# Patient Record
Sex: Male | Born: 1980
Health system: Southern US, Community
[De-identification: ages and names within clinical notes are randomized; demographics above are authoritative.]

## PROBLEM LIST (undated history)

## (undated) HISTORY — PX: NO PAST SURGERIES: SHX2092

---

## 2012-09-07 ENCOUNTER — Other Ambulatory Visit: Payer: Self-pay | Admitting: Family Medicine

## 2012-09-07 DIAGNOSIS — R1013 Epigastric pain: Secondary | ICD-10-CM

## 2012-09-09 ENCOUNTER — Ambulatory Visit
Admission: RE | Admit: 2012-09-09 | Discharge: 2012-09-09 | Disposition: A | Discharge status: Home / Self Care | Payer: BC Managed Care – PPO | Source: Ambulatory Visit | Attending: Family Medicine | Admitting: Family Medicine

## 2012-09-09 DIAGNOSIS — R1013 Epigastric pain: Secondary | ICD-10-CM

## 2012-11-30 ENCOUNTER — Other Ambulatory Visit: Payer: Self-pay | Admitting: Family Medicine

## 2012-11-30 DIAGNOSIS — R161 Splenomegaly, not elsewhere classified: Secondary | ICD-10-CM

## 2012-12-03 ENCOUNTER — Other Ambulatory Visit: Payer: BC Managed Care – PPO

## 2012-12-10 ENCOUNTER — Ambulatory Visit
Admission: RE | Admit: 2012-12-10 | Discharge: 2012-12-10 | Disposition: A | Discharge status: Home / Self Care | Payer: BC Managed Care – PPO | Source: Ambulatory Visit | Attending: Family Medicine | Admitting: Family Medicine

## 2012-12-10 DIAGNOSIS — R161 Splenomegaly, not elsewhere classified: Secondary | ICD-10-CM

## 2012-12-17 ENCOUNTER — Telehealth: Payer: Self-pay | Admitting: Hematology & Oncology

## 2012-12-17 NOTE — Telephone Encounter (Signed)
Left pt message to call and schedule appointment °

## 2012-12-18 ENCOUNTER — Telehealth: Payer: Self-pay | Admitting: Hematology & Oncology

## 2012-12-18 NOTE — Telephone Encounter (Signed)
Left pt message to call and schedule appointment °

## 2012-12-21 ENCOUNTER — Telehealth: Payer: Self-pay | Admitting: Hematology & Oncology

## 2012-12-21 NOTE — Telephone Encounter (Signed)
Pt made 5-30 appointment

## 2012-12-21 NOTE — Telephone Encounter (Signed)
Left message for pt to call and make appointment

## 2013-01-29 ENCOUNTER — Other Ambulatory Visit (HOSPITAL_BASED_OUTPATIENT_CLINIC_OR_DEPARTMENT_OTHER): Payer: BC Managed Care – PPO | Admitting: Lab

## 2013-01-29 ENCOUNTER — Ambulatory Visit (HOSPITAL_BASED_OUTPATIENT_CLINIC_OR_DEPARTMENT_OTHER): Payer: BC Managed Care – PPO | Admitting: Hematology & Oncology

## 2013-01-29 ENCOUNTER — Ambulatory Visit: Payer: BC Managed Care – PPO

## 2013-01-29 VITALS — BP 121/69 | HR 70 | Temp 98.2°F | Resp 18 | Ht 73.0 in | Wt 174.0 lb

## 2013-01-29 DIAGNOSIS — R161 Splenomegaly, not elsewhere classified: Secondary | ICD-10-CM

## 2013-01-29 LAB — CBC WITH DIFFERENTIAL (CANCER CENTER ONLY)
BASO%: 0.3 % (ref 0.0–2.0)
Eosinophils Absolute: 0.2 10*3/uL (ref 0.0–0.5)
LYMPH#: 1.4 10*3/uL (ref 0.9–3.3)
LYMPH%: 21 % (ref 14.0–48.0)
MONO#: 0.6 10*3/uL (ref 0.1–0.9)
NEUT#: 4.4 10*3/uL (ref 1.5–6.5)
Platelets: 186 10*3/uL (ref 145–400)
RBC: 4.93 10*6/uL (ref 4.20–5.70)
RDW: 12.5 % (ref 11.1–15.7)
WBC: 6.7 10*3/uL (ref 4.0–10.0)

## 2013-01-29 LAB — CHCC SATELLITE - SMEAR

## 2013-01-29 NOTE — Progress Notes (Signed)
This office note has been dictated.

## 2013-02-01 NOTE — Progress Notes (Signed)
CC:   Adaku Nnodi, MD  DIAGNOSIS:  Splenomegaly.  HISTORY OF PRESENT ILLNESS:  Mr. William Woodard is a really nice 32 year old white gentleman.  He has really had no medical issues.  He has had some recent abdominal discomfort.  This is mostly in the __________ upper quadrant.  He saw Dr. Ihor Dow at [facility].  On exam, it appeared that there was enlarged spleen.  Back in January, an ultrasound was done.  This showed some splenomegaly. Spleen measured 13.7 x 15.5 x 5.7 cm.  Everything else looked pretty much unremarkable.  A repeat ultrasound was done in April.  This basically showed stable splenomegaly.  Everything else looked fine within the abdomen and pelvis.  Liver looked okay.  There was no lymphadenopathy.  She then referred Mr. Ceci over to the [facility] for an evaluation.  He has had no fevers, sweats, or chills.  He does have occasional left upper quadrant abdominal discomfort.  He has had no change in bowel or bladder habits.  He has had no weight loss or weight gain.  There have been no rashes.  He has had no pruritus.  He does state, however, that when he drinks alcohol, there is some pain in the left upper quadrant.  He has had no dysphagia or odynophagia.  He has had no nausea or vomiting.  Overall, his performance status is ECOG 0.  PAST MEDIAL HISTORY:  Pretty much unremarkable.  ALLERGIES:  None.  MEDICATIONS:  Multivitamin daily.  SOCIAL HISTORY:  Negative for tobacco use.  There is rare alcohol use. He has no occupational exposures.  There is no obvious risk factor for hepatitis or HIV.  FAMILY HISTORY:  Noncontributory.  REVIEW OF SYSTEMS:  As stated in the History of Present Illness.  Again, there may be some occasional left upper quadrant abdominal discomfort.  PHYSICAL EXAMINATION:  General:  This is a well-developed, well- nourished white gentleman in no obvious distress.  Vital  signs: Temperature of 98.2, pulse 70, respiratory rate 18, blood pressure 121/69.  Weight is 174.  Head and neck:  Normocephalic, atraumatic skull.  There are no ocular or oral lesions.  There are no palpable cervical or supraclavicular lymph nodes.  Lungs:  Clear bilaterally. There are no rales, wheezes, or rhonchi.  Cardiac:  Regular rate and rhythm with a normal S1 and S2.  There are no murmurs, rubs, or bruits. Abdomen:  Soft, with good bowel sounds.  There is no palpable abdominal mass.  There is no fluid wave.  His spleen tip is about 3 or 4 cm below the left costal margin.  There is no palpable hepatomegaly.  Axillary: No bilateral axillary adenopathy.  Back:  No tenderness over the spine, ribs, or hips.  Extremities:  No clubbing, cyanosis, or edema. Neurological:  No focal neurological deficits.  Skin:  No rashes, ecchymoses, or petechiae.  LABORATORY STUDIES:  White cell count 6.7, hemoglobin 15.1, hematocrit 41.6, platelet count 186.  Peripheral smear shows a normochromic normocytic population of red blood cells.  There are no nucleated red blood cells.  There are no teardrop cells.  I see no rouleaux formations.  There are no schistocytes.  White cells appear normal in morphology and maturation.  There are no immature myeloid or lymphoid forms.  I see no blasts.  There are no hypersegmented polys.  Platelets are adequate in number and size.  IMPRESSION:  Mr. Arcidiacono is a 32 year old gentleman with splenomegaly.  By my  exam, it is as if I can feel his spleen.  He does have some pretty good abdominal muscles.  It is certainly possible I could be feeling the musculature and maybe not his spleen.  However, when I put him onto his right side, I can feel the spleen move with inspiration.  I think that a CT scan would be helpful.  I think this would give [place] a better look at the spleen.  It will give me an idea as to whether or not there is lymphadenopathy.  I am also sending off  a JAK2 assay on him.  I do not see anything that would suggest a collagen vascular disease. As such,  Felty syndrome would be unlikely.  I do not think that a bone marrow biopsy is going to be indicated right now.  However, if his JAK2 assay is positive, then I think a bone marrow biopsy would be reasonable.  I spent a good hour or so with Mr. Coolman.  Hopefully, we can try to take care of a lot of this over the phone.  I think that a splenectomy would be the last thing to perform in order to diagnosis this.  It is possible that he just may have a benign idiopathic splenomegaly.  We will plan to get Mr. Fedewa back in the future depending on what our CT scan shows and what our labs show.    ______________________________ Josph Macho, M.D. PRE/MEDQ  D:  01/29/2013  T:  01/30/2013  Job:  [id number]

## 2013-02-02 ENCOUNTER — Ambulatory Visit (HOSPITAL_BASED_OUTPATIENT_CLINIC_OR_DEPARTMENT_OTHER)
Admission: RE | Admit: 2013-02-02 | Discharge: 2013-02-02 | Disposition: A | Discharge status: Home / Self Care | Payer: BC Managed Care – PPO | Source: Ambulatory Visit | Attending: Hematology & Oncology | Admitting: Hematology & Oncology

## 2013-02-02 DIAGNOSIS — R161 Splenomegaly, not elsewhere classified: Secondary | ICD-10-CM

## 2013-02-02 LAB — HEMOGLOBINOPATHY EVALUATION
Hgb A2 Quant: 2.6 % (ref 2.2–3.2)
Hgb F Quant: 0 % (ref 0.0–2.0)

## 2013-02-02 LAB — LACTATE DEHYDROGENASE: LDH: 176 U/L (ref 94–250)

## 2013-02-02 LAB — RHEUMATOID FACTOR: Rhuematoid fact SerPl-aCnc: <10 IU/mL (ref <=14)

## 2013-02-02 MED ORDER — IOHEXOL 300 MG/ML  SOLN
100.0000 mL | Freq: Once | INTRAMUSCULAR | Status: AC | PRN
  Administered 2013-02-02: 100 mL via INTRAVENOUS

## 2013-02-05 ENCOUNTER — Other Ambulatory Visit (HOSPITAL_BASED_OUTPATIENT_CLINIC_OR_DEPARTMENT_OTHER): Payer: BC Managed Care – PPO

## 2013-11-09 IMAGING — CT CT ABD-PELV W/ CM
2 of 3 series · 14 of 46 positions shown, 16 images · IV contrast (omnipaque)
Comparison: Ultrasound abdomen 12/10/2012

CT CHEST

CLINICAL DATA: Splenomegaly.  Questionable non Hodgkin's lymphoma

CT CHEST, ABDOMEN AND PELVIS WITH CONTRAST
TECHNIQUE: Multidetector CT imaging of the chest, abdomen and
pelvis was performed following the standard protocol during bolus
administration of intravenous contrast.
Contrast: 100mL OMNIPAQUE IOHEXOL 300 MG/ML  SOLN,

[Series 2: chest/abd/pel 5.0 b31f · axial · 0.65mm/px · z∈[+882,+1488]mm · 11 of 139 slices shown, 13 images]
[im 9/139  soft-tissue]
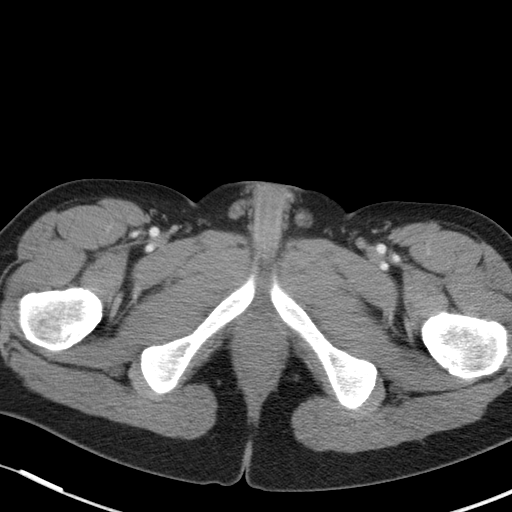
[im 9/139  bone]
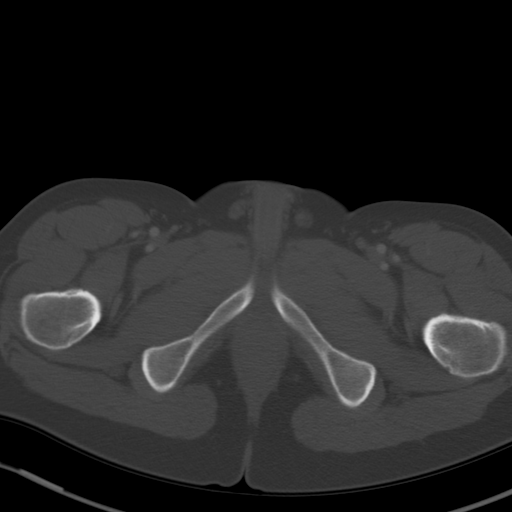
[im 23/139  soft-tissue]
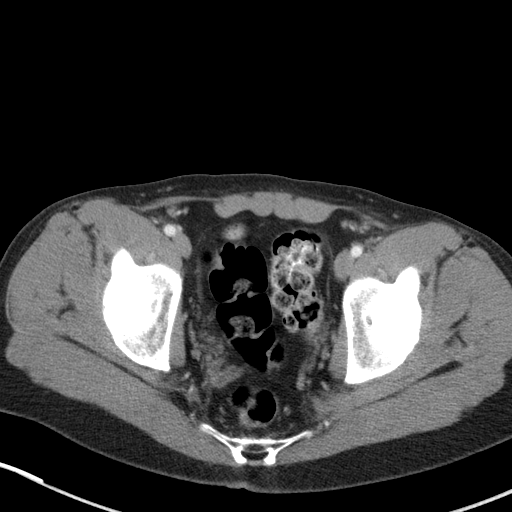
[im 32/139  soft-tissue]
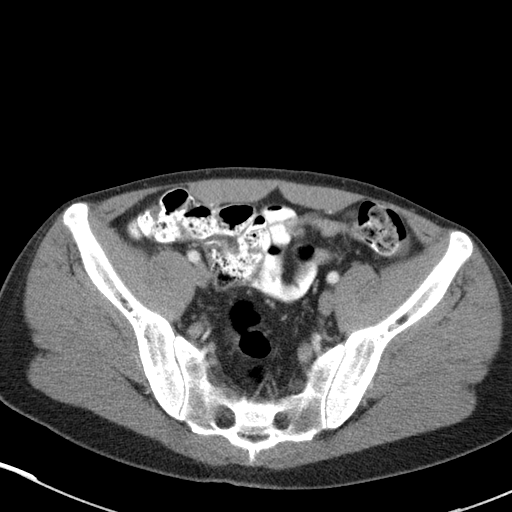
[im 45/139  soft-tissue]
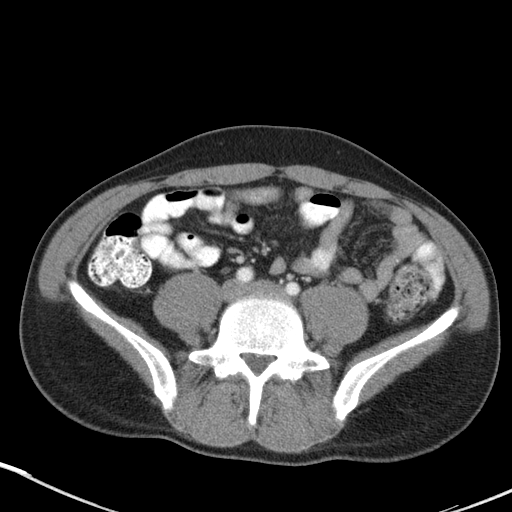
[im 58/139  soft-tissue]
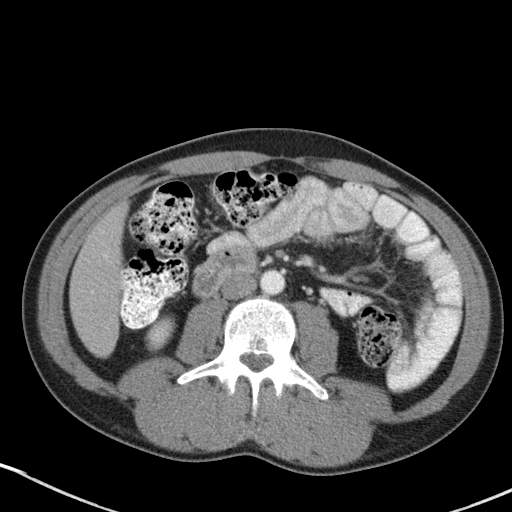
[im 72/139  soft-tissue]
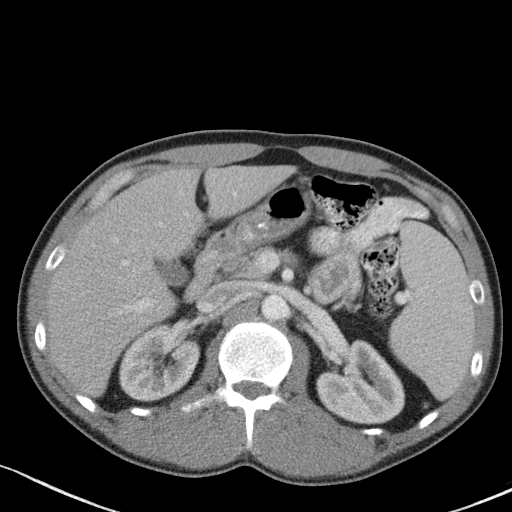
[im 81/139  soft-tissue]
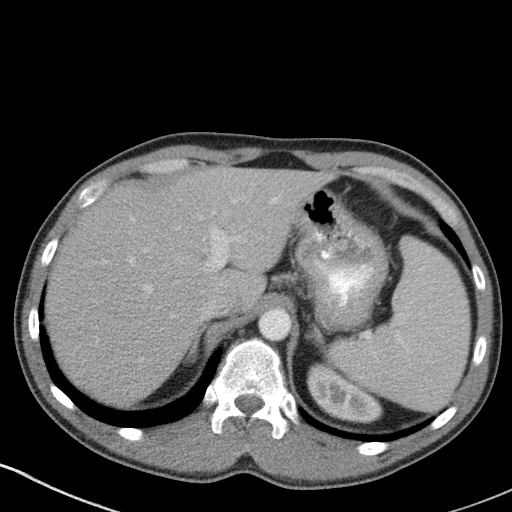
[im 94/139  soft-tissue]
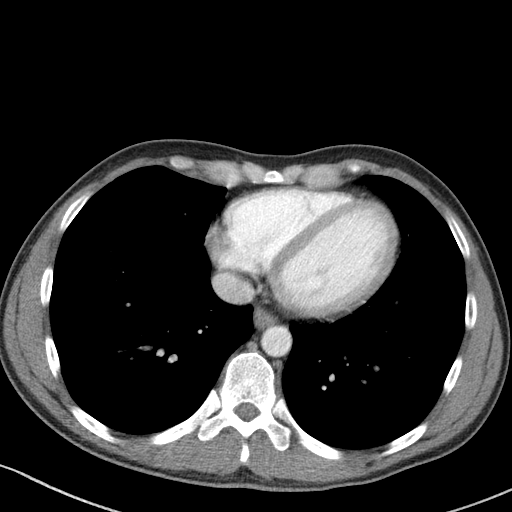
[im 107/139  soft-tissue]
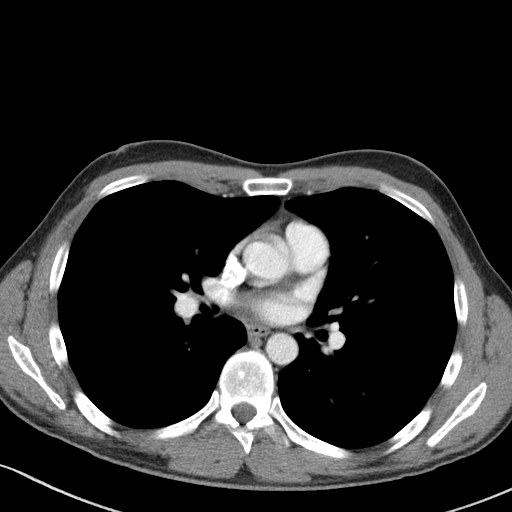
[im 107/139  bone]
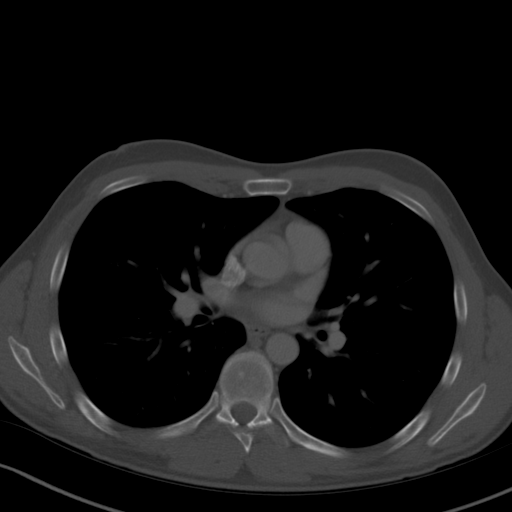
[im 116/139  soft-tissue]
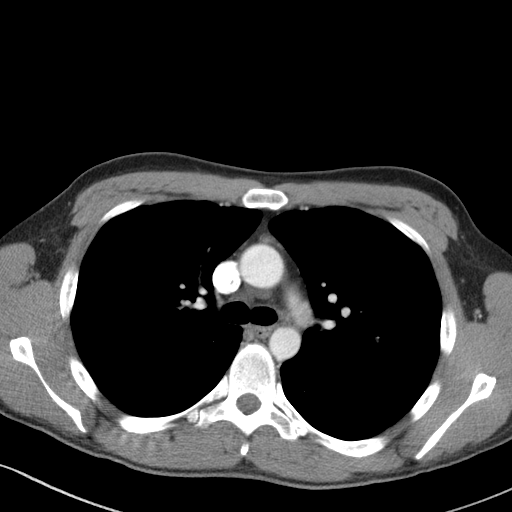
[im 130/139  soft-tissue]
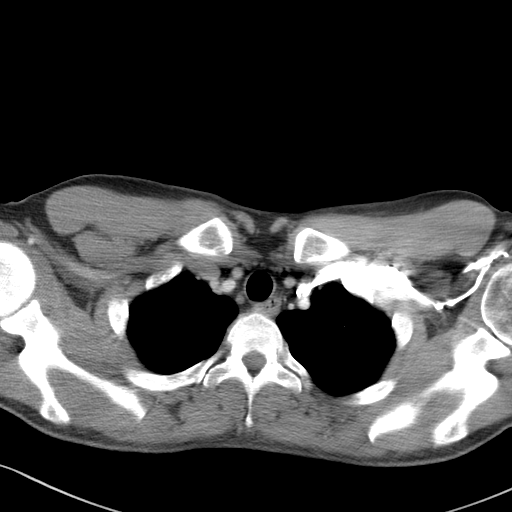

[Series 5: chest/abd/pel 3.0 coronal · coronal · 0.66mm/px · 3 of 78 slices shown]
[im 26/78  soft-tissue]
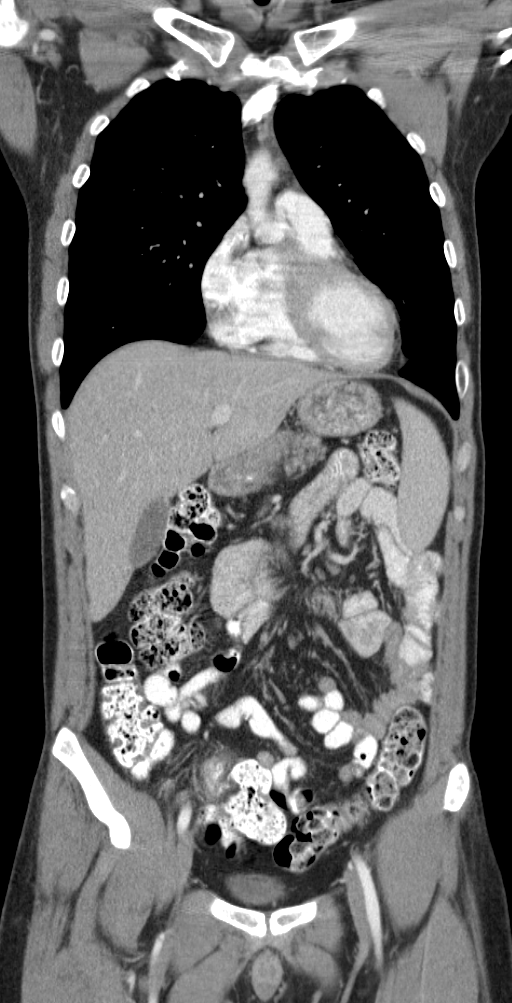
[im 35/78  soft-tissue]
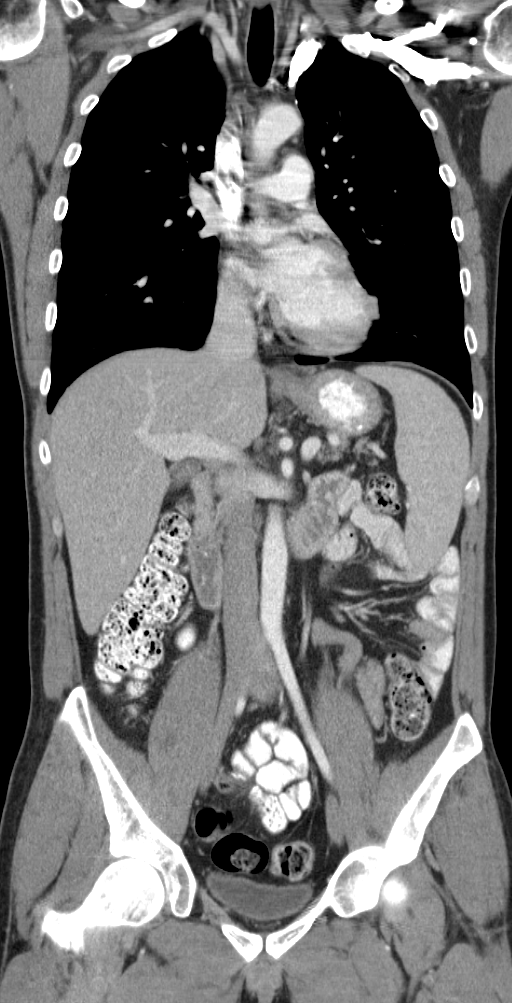
[im 43/78  soft-tissue]
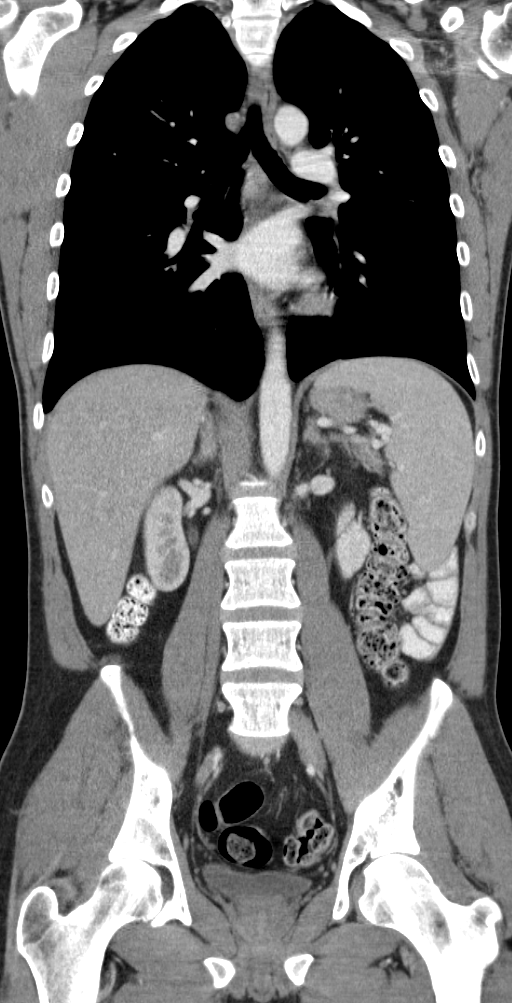

[14 of 46 positions shown; findings below may reference images not displayed]

FINDINGS: No axillary supraclavicular lymphadenopathy.  No
mediastinal hilar lymphadenopathy.  No pericardial fluid.

There is no suspicious pulmonary nodules.
IMPRESSION: No evidence of lymphadenopathy within the thorax.

CT ABDOMEN AND PELVIS
FINDINGS: The spleen measures 14 cm critical dimension and 9.6 x
5.8 cm and axial dimension for an estimated volume of 405 ml which
is within normal limits (normal spleen and this is less than 411
ml).

There is no focal hepatic lesion.  The gallbladder, pancreas,
adrenal glands, spleen and kidneys are normal.

The stomach, small bowel, and colon are normal.

Abdominal aorta normal caliber.  No retroperitoneal or periportal
lymphadenopathy.  No mesenteric adenopathy.

No free fluid the pelvis.  No pelvic or inguinal adenopathy.
Prostate gland bladder normal. Review of  bone windows demonstrates
no aggressive osseous lesions.
IMPRESSION: 1.  Spleen is upper limits of normal in volume.
2.  No evidence of lymphadenopathy in the abdomen or pelvis.

## 2016-07-09 ENCOUNTER — Ambulatory Visit (INDEPENDENT_AMBULATORY_CARE_PROVIDER_SITE_OTHER): Payer: BLUE CROSS/BLUE SHIELD | Admitting: Family Medicine

## 2016-07-09 ENCOUNTER — Encounter: Payer: Self-pay | Admitting: Family Medicine

## 2016-07-09 VITALS — BP 120/70 | HR 81 | Temp 98.1°F | Resp 16 | Ht 73.0 in | Wt 153.4 lb

## 2016-07-09 DIAGNOSIS — Z23 Encounter for immunization: Secondary | ICD-10-CM | POA: Diagnosis not present

## 2016-07-09 DIAGNOSIS — K219 Gastro-esophageal reflux disease without esophagitis: Secondary | ICD-10-CM | POA: Diagnosis not present

## 2016-07-09 DIAGNOSIS — Z0001 Encounter for general adult medical examination with abnormal findings: Secondary | ICD-10-CM | POA: Diagnosis not present

## 2016-07-09 DIAGNOSIS — R634 Abnormal weight loss: Secondary | ICD-10-CM

## 2016-07-09 DIAGNOSIS — F411 Generalized anxiety disorder: Secondary | ICD-10-CM | POA: Diagnosis not present

## 2016-07-09 DIAGNOSIS — Z0000 Encounter for general adult medical examination without abnormal findings: Secondary | ICD-10-CM

## 2016-07-09 LAB — BASIC METABOLIC PANEL
BUN: 15 mg/dL (ref 6–23)
CALCIUM: 9.6 mg/dL (ref 8.4–10.5)
CO2: 35 meq/L — AB (ref 19–32)
CREATININE: 1.12 mg/dL (ref 0.40–1.50)
Chloride: 101 mEq/L (ref 96–112)
GFR: 79 mL/min (ref >60.00)
Glucose, Bld: 87 mg/dL (ref 70–99)
Potassium: 4.2 mEq/L (ref 3.5–5.1)
SODIUM: 140 meq/L (ref 135–145)

## 2016-07-09 LAB — CBC WITH DIFFERENTIAL/PLATELET
Basophils Absolute: 0 10*3/uL (ref 0.0–0.1)
Basophils Relative: 1 % (ref 0.0–3.0)
EOS ABS: 0.1 10*3/uL (ref 0.0–0.7)
EOS PCT: 1.9 % (ref 0.0–5.0)
HCT: 49 % (ref 39.0–52.0)
HEMOGLOBIN: 16.9 g/dL (ref 13.0–17.0)
LYMPHS ABS: 1.8 10*3/uL (ref 0.7–4.0)
Lymphocytes Relative: 40.5 % (ref 12.0–46.0)
MCHC: 34.4 g/dL (ref 30.0–36.0)
MCV: 89.2 fl (ref 78.0–100.0)
MONO ABS: 0.3 10*3/uL (ref 0.1–1.0)
Monocytes Relative: 7 % (ref 3.0–12.0)
NEUTROS PCT: 49.6 % (ref 43.0–77.0)
Neutro Abs: 2.2 10*3/uL (ref 1.4–7.7)
Platelets: 236 10*3/uL (ref 150.0–400.0)
RBC: 5.49 Mil/uL (ref 4.22–5.81)
RDW: 12.9 % (ref 11.5–15.5)
WBC: 4.4 10*3/uL (ref 4.0–10.5)

## 2016-07-09 LAB — LIPID PANEL
CHOL/HDL RATIO: 3
Cholesterol: 160 mg/dL (ref 0–200)
HDL: 46 mg/dL (ref >39.00)
LDL CALC: 100 mg/dL — AB (ref 0–99)
NONHDL: 113.6
Triglycerides: 67 mg/dL (ref 0.0–149.0)
VLDL: 13.4 mg/dL (ref 0.0–40.0)

## 2016-07-09 LAB — HEPATIC FUNCTION PANEL
ALBUMIN: 4.6 g/dL (ref 3.5–5.2)
ALK PHOS: 69 U/L (ref 39–117)
ALT: 13 U/L (ref 0–53)
AST: 14 U/L (ref 0–37)
Bilirubin, Direct: 0.2 mg/dL (ref 0.0–0.3)
TOTAL PROTEIN: 6.8 g/dL (ref 6.0–8.3)
Total Bilirubin: 0.9 mg/dL (ref 0.2–1.2)

## 2016-07-09 LAB — TSH: TSH: 1.33 u[IU]/mL (ref 0.35–4.50)

## 2016-07-09 LAB — H. PYLORI ANTIBODY, IGG: H PYLORI IGG: NEGATIVE

## 2016-07-09 MED ORDER — RANITIDINE HCL 300 MG PO TABS: 300.0000 mg | ORAL_TABLET | Freq: Every day | ORAL | 6 refills | Status: DC

## 2016-07-09 NOTE — Addendum Note (Signed)
Addended by: Davis Gourd on: 07/09/2016 10:46 AM   Modules accepted: Orders

## 2016-07-09 NOTE — Progress Notes (Signed)
   Subjective:    Patient ID: William Woodard, male    DOB: 1981/04/21, 35 y.o.   MRN: [id number]  HPI New to establish.  Previous MD- no recent PCP  'i guess i'm one of those people that feel like something's always wrong w/ me'- pt reports high stress levels.  Pt started current job 3 yrs ago and he weighed 174.  Stress if high at work, doesn't eat regularly, now weighs 154 lbs and has not been trying to lose weight.  Intermittent palpitations.  No SOB.  Pt reports sleeping well at night- takes Melatonin.  Pt used to exercise to manage stress but 'I don't have time for that any more'.  + family hx of anxiety/depression.  Pt reports he doesn't have stress outside of work.  Working 5 days/week.  Not interested in medication to improve stress/anxiety.  CPE- no current meds.  Due for Tdap.  Declines flu shot.    Review of Systems Patient reports no vision/hearing changes, anorexia, fever ,adenopathy, persistant/recurrent hoarseness, swallowing issues, chest pain, edema, persistant/recurrent cough, hemoptysis, dyspnea (rest,exertional, paroxysmal nocturnal), gastrointestinal  bleeding (melena, rectal bleeding), abdominal pain, GU symptoms (dysuria, hematuria, voiding/incontinence issues) syncope, focal weakness, memory loss, numbness & tingling, skin/hair/nail changes, abnormal bruising/bleeding, musculoskeletal symptoms/signs.    + GERD    Objective:   Physical Exam General Appearance:    Alert, cooperative, no distress, appears stated age  Head:    Normocephalic, without obvious abnormality, atraumatic  Eyes:    PERRL, conjunctiva/corneas clear, EOM's intact, fundi    benign, both eyes       Ears:    Normal TM's and external ear canals, both ears  Nose:   Nares normal, septum midline, mucosa normal, no drainage   or sinus tenderness  Throat:   Lips, mucosa, and tongue normal; teeth and gums normal  Neck:   Supple, symmetrical, trachea midline, no adenopathy;       thyroid:  No  enlargement/tenderness/nodules  Back:     Symmetric, no curvature, ROM normal, no CVA tenderness  Lungs:     Clear to auscultation bilaterally, respirations unlabored  Chest wall:    No tenderness or deformity  Heart:    Regular rate and rhythm, S1 and S2 normal, no murmur, rub   or gallop  Abdomen:     Soft, non-tender, bowel sounds active all four quadrants,    no masses, no organomegaly  Genitalia:    Deferred at pt request  Rectal:    Extremities:   Extremities normal, atraumatic, no cyanosis or edema  Pulses:   2+ and symmetric all extremities  Skin:   Skin color, texture, turgor normal, no rashes or lesions  Lymph nodes:   Cervical, supraclavicular, and axillary nodes normal  Neurologic:   CNII-XII intact. Normal strength, sensation and reflexes      throughout          Assessment & Plan:  CPE- pt's PE WNL.  Due for Tdap- updated today.  Check labs.  Anticipatory guidance provided.   Anxiety- new.  Pt is highly anxious.  This is impacting his appetite, causing weight loss.  He reports no time for stress management.  Declines medication at this time.  Stressed need for stress management, eating regularly.  Will follow.  GERD- pt reports this is 'an all the time thing'.  Not eating regularly, increased stress.  Start Ranitidine to control acid and improve sxs.  Reviewed dietary and lifestyle modifications.  Will follow.

## 2016-07-09 NOTE — Progress Notes (Signed)
Pre visit review using our clinic review tool, if applicable. No additional management support is needed unless otherwise documented below in the visit note. 

## 2016-07-09 NOTE — Patient Instructions (Signed)
Follow up in 3-4 months to recheck weight loss We'll notify you of your lab results and make any changes if needed Make sure you are eating regularly- this is very important! Start the Ranitidine nightly to decrease excess acid production Please find a way to manage your stress!  This is very important Call with any questions or concerns Welcome!  We're glad to have you!!!

## 2016-07-10 ENCOUNTER — Encounter: Payer: Self-pay | Admitting: General Practice

## 2016-10-22 ENCOUNTER — Ambulatory Visit (INDEPENDENT_AMBULATORY_CARE_PROVIDER_SITE_OTHER): Payer: BLUE CROSS/BLUE SHIELD | Admitting: Family Medicine

## 2016-10-22 ENCOUNTER — Encounter: Payer: Self-pay | Admitting: Family Medicine

## 2016-10-22 VITALS — BP 118/72 | HR 68 | Temp 98.0°F | Resp 17 | Ht 73.0 in | Wt 156.5 lb

## 2016-10-22 DIAGNOSIS — B351 Tinea unguium: Secondary | ICD-10-CM | POA: Diagnosis not present

## 2016-10-22 NOTE — Patient Instructions (Signed)
There is no evidence of active infection at this time If you develop increased swelling, pain, or any drainage- please call me or MyChart me so we can send in antibiotics We will call you with your podiatry appt for assessment and treatment of the nail Call with any questions or concerns Hang in there!!!

## 2016-10-22 NOTE — Progress Notes (Signed)
Pre visit review using our clinic review tool, if applicable. No additional management support is needed unless otherwise documented below in the visit note. 

## 2016-10-22 NOTE — Progress Notes (Signed)
   Subjective:    Patient ID: William Woodard, male    DOB: [birth year], 36 y.o.   MRN: [id number]  HPI Toe pain- pt is having discomfort surrounding his R great toe nail.  Pain started ~2 weeks ago but has had discoloration for ~6 months.  Does not recall any injury.     Review of Systems For ROS see HPI     Objective:   Physical Exam  Constitutional: He is oriented to person, place, and time. He appears well-developed and well-nourished. No distress.  HENT:  Head: Normocephalic and atraumatic.  Cardiovascular: Intact distal pulses.   Neurological: He is alert and oriented to person, place, and time.  Skin: Skin is warm and dry. No rash noted. No erythema.  R great toenail yellowed, cracked, rough indicating fungal infection Mild discoloration of the skin surrounding the nail but no evidence of infection, induration, fluctuance, drainage  Psychiatric: He has a normal mood and affect. His behavior is normal. Thought content normal.  Vitals reviewed.         Assessment & Plan:  Toenail fungus- new.  No evidence of cellulitis at this time so no need for systemic antibiotics.  Will refer to podiatry given the condition of his great toe nail- as this may need to be removed.  Reviewed supportive care and red flags that should prompt return.  Pt expressed understanding and is in agreement w/ plan.

## 2016-10-29 ENCOUNTER — Ambulatory Visit: Payer: BLUE CROSS/BLUE SHIELD | Admitting: Family Medicine

## 2017-08-07 ENCOUNTER — Ambulatory Visit: Payer: Self-pay | Admitting: *Deleted

## 2017-08-07 NOTE — Telephone Encounter (Signed)
Patient has been taking his BP at work- yesterday his reading was 154/110 and today 164/110. While talking to him during protocol- asked him to take it again- his reading is 181/119- advised to have coworker take him to ED now- he agrees.  Reason for Disposition . [7] Systolic BP  >= 672 OR Diastolic >= 094 AND [7] cardiac or neurologic symptoms (e.g., chest pain, difficulty breathing, unsteady gait, blurred vision)  Answer Assessment - Initial Assessment Questions 1. BLOOD PRESSURE: "What is the blood pressure?" "Did you take at least two measurements 5 minutes apart?"     164/110 10:00 2. ONSET: "When did you take your blood pressure?"     This morning and yesterday 3. HOW: "How did you obtain the blood pressure?" (e.g., visiting nurse, automatic home BP monitor)     Automatic machine 4. HISTORY: "Do you have a history of high blood pressure?"     no 5. MEDICATIONS: "Are you taking any medications for blood pressure?" "Have you missed any doses recently?"     no 6. OTHER SYMPTOMS: "Do you have any symptoms?" (e.g., headache, chest pain, blurred vision, difficulty breathing, weakness)     Headache,eye fatigue,tention 7. PREGNANCY: "Is there any chance you are pregnant?" "When was your last menstrual period?"     n/a  Protocols used: HIGH BLOOD PRESSURE-A-AH

## 2017-08-08 ENCOUNTER — Ambulatory Visit: Payer: BLUE CROSS/BLUE SHIELD | Admitting: Physician Assistant

## 2017-08-08 ENCOUNTER — Encounter: Payer: Self-pay | Admitting: Physician Assistant

## 2017-08-08 ENCOUNTER — Encounter: Payer: Self-pay | Admitting: Emergency Medicine

## 2017-08-08 VITALS — BP 118/78 | HR 80 | Temp 98.5°F | Resp 14 | Ht 73.0 in | Wt 163.0 lb

## 2017-08-08 DIAGNOSIS — R030 Elevated blood-pressure reading, without diagnosis of hypertension: Secondary | ICD-10-CM

## 2017-08-08 LAB — BASIC METABOLIC PANEL
BUN: 11 mg/dL (ref 6–23)
CHLORIDE: 102 meq/L (ref 96–112)
CO2: 32 meq/L (ref 19–32)
CREATININE: 1.12 mg/dL (ref 0.40–1.50)
Calcium: 9.4 mg/dL (ref 8.4–10.5)
GFR: 78.52 mL/min (ref >60.00)
Glucose, Bld: 101 mg/dL — ABNORMAL HIGH (ref 70–99)
Potassium: 4.3 mEq/L (ref 3.5–5.1)
Sodium: 140 mEq/L (ref 135–145)

## 2017-08-08 NOTE — Progress Notes (Signed)
Patient presents to clinic today for follow-up regarding elevated BP measurements x 2 days at work. Patient states he had a slight headache and eye strain 2 days ago at work. Coworker told him he should check his BP with her machine. He took BP at noted it to be elevated at 150/110. Patient states headache went away and he was feeling fine. The next day at work he was feeling fine. Coworker suggested he recheck BP due to elevation noted the day before. Checked with her cuff and BP noted to be at 168/120. States he called the office and was triaged by the nurse who recommended he recheck once more while on the phone with her. BP at that time at 190/110. He was sent to [facility] ER for assessment. Patient notes when at the ER BP was 130/80. States he left as BP was fine and he had no symptoms. Went to [facility] to recheck BP later that day and was at 130/80. Notes going back to work to check on coworkers cuff to see if it matched. This cuff, a wrist cuff noted BP at 160/110. Patient states he feels this cuff is inaccurate and has been checking BP at [facility] daily with normal readings. Patient denies chest pain, palpitation, lightheadedness/dizziness or shortness of breath. Denies anxiety or panic attack. Notes some work-related stressors. Notes eye strain occasionally with staring at a computer screen all day. Has been 1 year since last eye exam. States he is feeling fine today. No complaints.  History reviewed. No pertinent past medical history.  Current Outpatient Medications on File Prior to Visit  Medication Sig Dispense Refill  . ranitidine (ZANTAC) 300 MG tablet Take 1 tablet (300 mg total) by mouth at bedtime. (Patient not taking: Reported on 08/08/2017) 30 tablet 6   No current facility-administered medications on file prior to visit.     No Known Allergies  Family History  Problem Relation Age of Onset  . Healthy Mother   . Healthy Father   . Healthy Sister   . Healthy Brother   . Cancer Maternal  Grandmother        lung  . Emphysema Maternal Grandmother   . Dementia Paternal Grandmother     Social History   Socioeconomic History  . Marital status: Married    Spouse name: None  . Number of children: None  . Years of education: None  . Highest education level: None  Social Needs  . Financial resource strain: None  . Food insecurity - worry: None  . Food insecurity - inability: None  . Transportation needs - medical: None  . Transportation needs - non-medical: None  Occupational History  . None  Tobacco Use  . Smoking status: Never Smoker  . Smokeless tobacco: Never Used  Substance and Sexual Activity  . Alcohol use: No  . Drug use: No  . Sexual activity: None  Other Topics Concern  . None  Social History Narrative  . None   Review of Systems - See HPI.  All other ROS are negative.  BP 118/78   Pulse 80   Temp 98.5 F (36.9 C) (Oral)   Resp 14   Ht 6\' 1"  (1.854 m)   Wt 163 lb (73.9 kg)   SpO2 99%   BMI 21.51 kg/m   Physical Exam  Constitutional: He is oriented to person, place, and time and well-developed, well-nourished, and in no distress.  HENT:  Head: Normocephalic and atraumatic.  Eyes: Conjunctivae are normal.  Neck: Neck  supple.  Cardiovascular: Normal rate, regular rhythm, normal heart sounds and intact distal pulses.  Pulmonary/Chest: Effort normal and breath sounds normal. No respiratory distress. He has no wheezes. He has no rales. He exhibits no tenderness.  Neurological: He is alert and oriented to person, place, and time.  Skin: Skin is warm and dry. No rash noted.  Psychiatric: Affect normal.  Vitals reviewed.  Assessment/Plan: 1. Elevated BP without diagnosis of hypertension BP normotensive here in office today and on patient's readings from [facility]. Concern that work cuff is inaccurate. Will check BMP today. Patient encouraged to follow DASH diet and to get a home BP cuff so he can monitor daily over the next week. Also encouraged  patient to compare reading to work cuff to verify this cuff is inaccurate and should be tossed. Follow-up scheduled. ER precautions reviewed with patient.  - Basic metabolic panel   Leeanne Rio, PA-C

## 2017-08-08 NOTE — Patient Instructions (Addendum)
Please go to the lab today for blood work.  I will call you with your results. We will alter treatment regimen(s) if indicated by your results.   Please stay well hydrated and get plenty of rest. Keep a well-balanced diet low in salt (see DASH diet below).  I want you to get a BP cuff for use at home.  Check daily over the next week and record.  I also want you to check it at work so you can compare with the work BP machine as I feel the work machine is inaccurate.  If you note BP elevated above 140/90 please call me.  Follow-up in 2 weeks for reassessment.   DASH Eating Plan DASH stands for "Dietary Approaches to Stop Hypertension." The DASH eating plan is a healthy eating plan that has been shown to reduce high blood pressure (hypertension). It may also reduce your risk for type 2 diabetes, heart disease, and stroke. The DASH eating plan may also help with weight loss. What are tips for following this plan? General guidelines  Avoid eating more than 2,300 mg (milligrams) of salt (sodium) a day. If you have hypertension, you may need to reduce your sodium intake to 1,500 mg a day.  Limit alcohol intake to no more than 1 drink a day for nonpregnant women and 2 drinks a day for men. One drink equals 12 oz of beer, 5 oz of wine, or 1 oz of hard liquor.  Work with your health care provider to maintain a healthy body weight or to lose weight. Ask what an ideal weight is for you.  Get at least 30 minutes of exercise that causes your heart to beat faster (aerobic exercise) most days of the week. Activities may include walking, swimming, or biking.  Work with your health care provider or diet and nutrition specialist (dietitian) to adjust your eating plan to your individual calorie needs. Reading food labels  Check food labels for the amount of sodium per serving. Choose foods with less than 5 percent of the Daily Value of sodium. Generally, foods with less than 300 mg of sodium per serving  fit into this eating plan.  To find whole grains, look for the word "whole" as the first word in the ingredient list. Shopping  Buy products labeled as "low-sodium" or "no salt added."  Buy fresh foods. Avoid canned foods and premade or frozen meals. Cooking  Avoid adding salt when cooking. Use salt-free seasonings or herbs instead of table salt or sea salt. Check with your health care provider or pharmacist before using salt substitutes.  Do not fry foods. Cook foods using healthy methods such as baking, boiling, grilling, and broiling instead.  Cook with heart-healthy oils, such as olive, canola, soybean, or sunflower oil. Meal planning   Eat a balanced diet that includes: ? 5 or more servings of fruits and vegetables each day. At each meal, try to fill half of your plate with fruits and vegetables. ? Up to 6-8 servings of whole grains each day. ? Less than 6 oz of lean meat, poultry, or fish each day. A 3-oz serving of meat is about the same size as a deck of cards. One egg equals 1 oz. ? 2 servings of low-fat dairy each day. ? A serving of nuts, seeds, or beans 5 times each week. ? Heart-healthy fats. Healthy fats called Omega-3 fatty acids are found in foods such as flaxseeds and coldwater fish, like sardines, salmon, and mackerel.  Limit how much  you eat of the following: ? Canned or prepackaged foods. ? Food that is high in trans fat, such as fried foods. ? Food that is high in saturated fat, such as fatty meat. ? Sweets, desserts, sugary drinks, and other foods with added sugar. ? Full-fat dairy products.  Do not salt foods before eating.  Try to eat at least 2 vegetarian meals each week.  Eat more home-cooked food and less restaurant, buffet, and fast food.  When eating at a restaurant, ask that your food be prepared with less salt or no salt, if possible. What foods are recommended? The items listed may not be a complete list. Talk with your dietitian about what  dietary choices are best for you. Grains Whole-grain or whole-wheat bread. Whole-grain or whole-wheat pasta. Brown rice. [place]row. Bulgur. Whole-grain and low-sodium cereals. Pita bread. Low-fat, low-sodium crackers. Whole-wheat flour tortillas. Vegetables Fresh or frozen vegetables (raw, steamed, roasted, or grilled). Low-sodium or reduced-sodium tomato and vegetable juice. Low-sodium or reduced-sodium tomato sauce and tomato paste. Low-sodium or reduced-sodium canned vegetables. Fruits All fresh, dried, or frozen fruit. Canned fruit in natural juice (without added sugar). Meat and other protein foods Skinless chicken or [place]. Ground chicken or [place]. Pork with fat trimmed off. Fish and seafood. Egg whites. Dried beans, peas, or lentils. Unsalted nuts, nut butters, and seeds. Unsalted canned beans. Lean cuts of beef with fat trimmed off. Low-sodium, lean deli meat. Dairy Low-fat (1%) or fat-free (skim) milk. Fat-free, low-fat, or reduced-fat cheeses. Nonfat, low-sodium ricotta or cottage cheese. Low-fat or nonfat yogurt. Low-fat, low-sodium cheese. Fats and oils Soft margarine without trans fats. Vegetable oil. Low-fat, reduced-fat, or light mayonnaise and salad dressings (reduced-sodium). Canola, safflower, olive, soybean, and sunflower oils. Avocado. Seasoning and other foods Herbs. Spices. Seasoning mixes without salt. Unsalted popcorn and pretzels. Fat-free sweets. What foods are not recommended? The items listed may not be a complete list. Talk with your dietitian about what dietary choices are best for you. Grains Baked goods made with fat, such as croissants, muffins, or some breads. Dry pasta or rice meal packs. Vegetables Creamed or fried vegetables. Vegetables in a cheese sauce. Regular canned vegetables (not low-sodium or reduced-sodium). Regular canned tomato sauce and paste (not low-sodium or reduced-sodium). Regular tomato and vegetable juice (not low-sodium or  reduced-sodium). Angie Fava. Olives. Fruits Canned fruit in a light or heavy syrup. Fried fruit. Fruit in cream or butter sauce. Meat and other protein foods Fatty cuts of meat. Ribs. Fried meat. Berniece Salines. Sausage. Bologna and other processed lunch meats. Salami. Fatback. Hotdogs. Bratwurst. Salted nuts and seeds. Canned beans with added salt. Canned or smoked fish. Whole eggs or egg yolks. Chicken or [place] with skin. Dairy Whole or 2% milk, cream, and half-and-half. Whole or full-fat cream cheese. Whole-fat or sweetened yogurt. Full-fat cheese. Nondairy creamers. Whipped toppings. Processed cheese and cheese spreads. Fats and oils Butter. Stick margarine. Lard. Shortening. Ghee. Bacon fat. Tropical oils, such as coconut, palm kernel, or palm oil. Seasoning and other foods Salted popcorn and pretzels. Onion salt, garlic salt, seasoned salt, table salt, and sea salt. Worcestershire sauce. Tartar sauce. Barbecue sauce. Teriyaki sauce. Soy sauce, including reduced-sodium. Steak sauce. Canned and packaged gravies. Fish sauce. Oyster sauce. Cocktail sauce. Horseradish that you find on the shelf. Ketchup. Mustard. Meat flavorings and tenderizers. Bouillon cubes. Hot sauce and Tabasco sauce. Premade or packaged marinades. Premade or packaged taco seasonings. Relishes. Regular salad dressings. Where to find more information:  National Heart, Lung, and Blood Institute: https://wilson-eaton.com/  American Heart Association: www.heart.org Summary  The DASH eating plan is a healthy eating plan that has been shown to reduce high blood pressure (hypertension). It may also reduce your risk for type 2 diabetes, heart disease, and stroke.  With the DASH eating plan, you should limit salt (sodium) intake to 2,300 mg a day. If you have hypertension, you may need to reduce your sodium intake to 1,500 mg a day.  When on the DASH eating plan, aim to eat more fresh fruits and vegetables, whole grains, lean proteins, low-fat  dairy, and heart-healthy fats.  Work with your health care provider or diet and nutrition specialist (dietitian) to adjust your eating plan to your individual calorie needs. This information is not intended to replace advice given to you by your health care provider. Make sure you discuss any questions you have with your health care provider. Document Released: 08/08/2011 Document Revised: 08/12/2016 Document Reviewed: 08/12/2016 Elsevier Interactive Patient Education  2017 [employer].

## 2017-09-09 ENCOUNTER — Ambulatory Visit: Payer: BLUE CROSS/BLUE SHIELD | Admitting: Physician Assistant

## 2017-10-28 ENCOUNTER — Ambulatory Visit: Payer: 59 | Admitting: Family Medicine

## 2017-10-28 ENCOUNTER — Encounter: Payer: Self-pay | Admitting: Family Medicine

## 2017-10-28 ENCOUNTER — Other Ambulatory Visit: Payer: Self-pay

## 2017-10-28 VITALS — BP 120/80 | HR 66 | Temp 98.2°F | Ht 73.0 in | Wt 166.6 lb

## 2017-10-28 DIAGNOSIS — L42 Pityriasis rosea: Secondary | ICD-10-CM | POA: Diagnosis not present

## 2017-10-28 NOTE — Progress Notes (Signed)
   Subjective  CC:  Chief Complaint  Patient presents with  . Rash    Patient reports rash on Stomach x 2 weeks, itchy     HPI: William Woodard is a 37 y.o. male who presents to the office today to address the problems listed above in the chief complaint.  Healthy 37 year old male with 2-week history of rash.  First noticed on abdomen, now with diffuse red macular rash with some flaking and very mild itching intermittently.  Rash is more pronounced when he is hot or sweaty.  He does work out at [employer] is concerned about picking up something there.  The rash is no longer spreading.  It is not on his limbs.  He has no other associated symptoms.  He has been using an over-the-counter amino acid supplement and eating cottage cheese daily for the last 2 weeks and wonders about an allergic reaction.  No hives. I reviewed the patients updated PMH, FH, and SocHx.    Patient Active Problem List   Diagnosis Date Noted  . Gastroesophageal reflux disease 07/09/2016  . Anxiety state 07/09/2016   No outpatient medications have been marked as taking for the 10/28/17 encounter (Office Visit) with Leamon Arnt, MD.    Allergies: Patient has No Known Allergies. Family History: Patient family history includes Cancer in his maternal grandmother; Dementia in his paternal grandmother; Emphysema in his maternal grandmother; Healthy in his brother, father, mother, and sister. Social History:  Patient  reports that  has never smoked. he has never used smokeless tobacco. He reports that he does not drink alcohol or use drugs.  Review of Systems: Constitutional: Negative for fever malaise or anorexia Cardiovascular: negative for chest pain Respiratory: negative for SOB or persistent cough Gastrointestinal: negative for abdominal pain  Objective  Vitals: BP 120/80   Pulse 66   Temp 98.2 F (36.8 C)   Ht 6\' 1"  (1.854 m)   Wt 166 lb 9.6 oz (75.6 kg)   BMI 21.98 kg/m  General: no acute distress ,  A&Ox3 HEENT: Normal oral mucosa  Skin:  Warm, trunk with maculopapular oval rash with some flaking, herald patch noted on left abdomen, no urticaria, vesicles.  Limbs are clear  Assessment  1. Pityriasis rosea      Plan   PR: Educated on diagnosis and prognosis.  Supportive care.  Unlikely to be allergic rash.  See after visit summary for educational handout  Follow up: As needed   Commons side effects, risks, benefits, and alternatives for medications and treatment plan prescribed today were discussed, and the patient expressed understanding of the given instructions. Patient is instructed to call or message via MyChart if he/she has any questions or concerns regarding our treatment plan. No barriers to understanding were identified. We discussed Red Flag symptoms and signs in detail. Patient expressed understanding regarding what to do in case of urgent or emergency type symptoms.   Medication list was reconciled, printed and provided to the patient in AVS. Patient instructions and summary information was reviewed with the patient as documented in the AVS. This note was prepared with assistance of Dragon voice recognition software. Occasional wrong-word or sound-a-like substitutions may have occurred due to the inherent limitations of voice recognition software  No orders of the defined types were placed in this encounter.  No orders of the defined types were placed in this encounter.

## 2017-10-28 NOTE — Patient Instructions (Addendum)
Please follow up if symptoms do not improve or as needed.   Pityriasis Rosea Pityriasis rosea is a rash that usually appears on the trunk of the body. It may also appear on the upper arms and upper legs. It usually begins as a single patch, and then more patches begin to develop. The rash may cause mild itching, but it normally does not cause other problems. It usually goes away without treatment. However, it may take weeks or months for the rash to go away completely. What are the causes? The cause of this condition is not known. The condition does not spread from person to person (is noncontagious). What increases the risk? This condition is more likely to develop in young adults and children. It is most common in the spring and fall. What are the signs or symptoms? The main symptom of this condition is a rash.  The rash usually begins with a single oval patch that is larger than the ones that follow. This is called a herald patch. It generally appears a week or more before the rest of the rash appears.  When more patches start to develop, they spread quickly on the trunk, back, and arms. These patches are smaller than the first one.  The patches that make up the rash are usually oval-shaped and pink or red in color. They are usually flat, but they may sometimes be raised so that they can be felt with a finger. They may also be finely crinkled and have a scaly ring around the edge.  The rash does not typically appear on areas of the skin that are exposed to the sun.  Most people who have this condition do not have other symptoms, but some have mild itching. In a few cases, a mild headache or body aches may occur before the rash appears and then go away. How is this diagnosed? Your health care provider may diagnose this condition by doing a physical exam and taking your medical history. To rule out other possible causes for the rash, the health care provider may order blood tests or take a skin  sample from the rash to be looked at under a microscope. How is this treated? Usually, treatment is not needed for this condition. The rash will probably go away on its own in 4-8 weeks. In some cases, a health care provider may recommend or prescribe medicine to reduce itching. Follow these instructions at home:  Take medicines only as directed by your health care provider.  Avoid scratching the affected areas of skin.  Do not take hot baths or use a sauna. Use only warm water when bathing or showering. Heat can increase itching. Contact a health care provider if:  Your rash does not go away in 8 weeks.  Your rash gets much worse.  You have a fever.  You have swelling or pain in the rash area.  You have fluid, blood, or pus coming from the rash area. This information is not intended to replace advice given to you by your health care provider. Make sure you discuss any questions you have with your health care provider. Document Released: 09/25/2001 Document Revised: 01/25/2016 Document Reviewed: 07/27/2014 Elsevier Interactive Patient Education  [employer].

## 2017-11-11 DIAGNOSIS — H5213 Myopia, bilateral: Secondary | ICD-10-CM | POA: Diagnosis not present

## 2017-11-11 DIAGNOSIS — H52223 Regular astigmatism, bilateral: Secondary | ICD-10-CM | POA: Diagnosis not present

## 2017-11-21 ENCOUNTER — Telehealth: Payer: Self-pay | Admitting: Family Medicine

## 2017-11-21 NOTE — Telephone Encounter (Signed)
Copied from [place] [phone]. Topic: Appointment Scheduling - Scheduling Inquiry for Clinic >> Nov 21, 2017  1:07 PM Hewitt Shorts wrote: Reason for CRM: pt is needing with his PCP Birdie Riddle and is needing this before July due to insurance protocal and nothing is available  Best number [phone]

## 2017-12-23 ENCOUNTER — Encounter: Payer: Self-pay | Admitting: Physician Assistant

## 2017-12-23 ENCOUNTER — Ambulatory Visit (INDEPENDENT_AMBULATORY_CARE_PROVIDER_SITE_OTHER): Payer: 59 | Admitting: Physician Assistant

## 2017-12-23 ENCOUNTER — Other Ambulatory Visit: Payer: Self-pay

## 2017-12-23 VITALS — BP 122/70 | HR 80 | Temp 98.5°F | Resp 14 | Ht 72.5 in | Wt 173.0 lb

## 2017-12-23 DIAGNOSIS — Z0000 Encounter for general adult medical examination without abnormal findings: Secondary | ICD-10-CM

## 2017-12-23 LAB — LIPID PANEL
CHOL/HDL RATIO: 4
Cholesterol: 152 mg/dL (ref 0–200)
HDL: 37.7 mg/dL — ABNORMAL LOW (ref >39.00)
LDL Cholesterol: 98 mg/dL (ref 0–99)
NONHDL: 113.88
Triglycerides: 79 mg/dL (ref 0.0–149.0)
VLDL: 15.8 mg/dL (ref 0.0–40.0)

## 2017-12-23 LAB — CBC WITH DIFFERENTIAL/PLATELET
BASOS ABS: 0.1 10*3/uL (ref 0.0–0.1)
Basophils Relative: 1 % (ref 0.0–3.0)
EOS ABS: 0.1 10*3/uL (ref 0.0–0.7)
Eosinophils Relative: 1.9 % (ref 0.0–5.0)
HCT: 48.5 % (ref 39.0–52.0)
Hemoglobin: 16.9 g/dL (ref 13.0–17.0)
LYMPHS ABS: 1.3 10*3/uL (ref 0.7–4.0)
Lymphocytes Relative: 24.5 % (ref 12.0–46.0)
MCHC: 34.9 g/dL (ref 30.0–36.0)
MCV: 88.7 fl (ref 78.0–100.0)
Monocytes Absolute: 0.5 10*3/uL (ref 0.1–1.0)
Monocytes Relative: 9.8 % (ref 3.0–12.0)
NEUTROS ABS: 3.4 10*3/uL (ref 1.4–7.7)
NEUTROS PCT: 62.8 % (ref 43.0–77.0)
PLATELETS: 202 10*3/uL (ref 150.0–400.0)
RBC: 5.46 Mil/uL (ref 4.22–5.81)
RDW: 13.3 % (ref 11.5–15.5)
WBC: 5.4 10*3/uL (ref 4.0–10.5)

## 2017-12-23 LAB — COMPREHENSIVE METABOLIC PANEL
ALT: 19 U/L (ref 0–53)
AST: 18 U/L (ref 0–37)
Albumin: 4.2 g/dL (ref 3.5–5.2)
Alkaline Phosphatase: 77 U/L (ref 39–117)
BILIRUBIN TOTAL: 0.8 mg/dL (ref 0.2–1.2)
BUN: 13 mg/dL (ref 6–23)
CO2: 31 mEq/L (ref 19–32)
CREATININE: 1.06 mg/dL (ref 0.40–1.50)
Calcium: 9 mg/dL (ref 8.4–10.5)
Chloride: 104 mEq/L (ref 96–112)
GFR: 83.5 mL/min (ref >60.00)
GLUCOSE: 94 mg/dL (ref 70–99)
Potassium: 3.8 mEq/L (ref 3.5–5.1)
SODIUM: 140 meq/L (ref 135–145)
Total Protein: 6.1 g/dL (ref 6.0–8.3)

## 2017-12-23 LAB — HEMOGLOBIN A1C: Hgb A1c MFr Bld: 4.6 % (ref 4.6–6.5)

## 2017-12-23 NOTE — Assessment & Plan Note (Signed)
Depression screen negative. Health Maintenance reviewed -- Declines HIV screen. Immunizations are up-to-date. Preventive schedule discussed and handout given in AVS. Will obtain fasting labs today.

## 2017-12-23 NOTE — Patient Instructions (Signed)
Please go to the lab for blood work.   Our office will call you with your results unless you have chosen to receive results via MyChart.  If your blood work is normal we will follow-up each year for physicals and as scheduled for chronic medical problems.  If anything is abnormal we will treat accordingly and get you in for a follow-up.  Use some saline nasal rinse to flush out nasal passages. Keep hydrated and rest. Can use OTC cold/sinus medications if needed.  Let me know if things are not calming down.   Preventive Care 18-39 Years, Male Preventive care refers to lifestyle choices and visits with your health care provider that can promote health and wellness. What does preventive care include?  A yearly physical exam. This is also called an annual well check.  Dental exams once or twice a year.  Routine eye exams. Ask your health care provider how often you should have your eyes checked.  Personal lifestyle choices, including: ? Daily care of your teeth and gums. ? Regular physical activity. ? Eating a healthy diet. ? Avoiding tobacco and drug use. ? Limiting alcohol use. ? Practicing safe sex. What happens during an annual well check? The services and screenings done by your health care provider during your annual well check will depend on your age, overall health, lifestyle risk factors, and family history of disease. Counseling Your health care provider may ask you questions about your:  Alcohol use.  Tobacco use.  Drug use.  Emotional well-being.  Home and relationship well-being.  Sexual activity.  Eating habits.  Work and work Statistician.  Screening You may have the following tests or measurements:  Height, weight, and BMI.  Blood pressure.  Lipid and cholesterol levels. These may be checked every 5 years starting at age 49.  Diabetes screening. This is done by checking your blood sugar (glucose) after you have not eaten for a while  (fasting).  Skin check.  Hepatitis C blood test.  Hepatitis B blood test.  Sexually transmitted disease (STD) testing.  Discuss your test results, treatment options, and if necessary, the need for more tests with your health care provider. Vaccines Your health care provider may recommend certain vaccines, such as:  Influenza vaccine. This is recommended every year.  Tetanus, diphtheria, and acellular pertussis (Tdap, Td) vaccine. You may need a Td booster every 10 years.  Varicella vaccine. You may need this if you have not been vaccinated.  HPV vaccine. If you are 63 or younger, you may need three doses over 6 months.  Measles, mumps, and rubella (MMR) vaccine. You may need at least one dose of MMR.You may also need a second dose.  Pneumococcal 13-valent conjugate (PCV13) vaccine. You may need this if you have certain conditions and have not been vaccinated.  Pneumococcal polysaccharide (PPSV23) vaccine. You may need one or two doses if you smoke cigarettes or if you have certain conditions.  Meningococcal vaccine. One dose is recommended if you are age 69-21 years and a first-year college student living in a residence hall, or if you have one of several medical conditions. You may also need additional booster doses.  Hepatitis A vaccine. You may need this if you have certain conditions or if you travel or work in places where you may be exposed to hepatitis A.  Hepatitis B vaccine. You may need this if you have certain conditions or if you travel or work in places where you may be exposed to hepatitis B.  Haemophilus influenzae type b (Hib) vaccine. You may need this if you have certain risk factors.  Talk to your health care provider about which screenings and vaccines you need and how often you need them. This information is not intended to replace advice given to you by your health care provider. Make sure you discuss any questions you have with your health care  provider. Document Released: 10/15/2001 Document Revised: 05/08/2016 Document Reviewed: 06/20/2015 Elsevier Interactive Patient Education  [employer].

## 2017-12-23 NOTE — Progress Notes (Signed)
Patient presents to clinic today for annual exam.  Patient is fasting for labs.  Diet -- Endorses clean diet. Is meal prepping which helps with food choices and portion sizes. Tries to stay well-hydrated. Rare soda.  Exercise -- 5-6 days a week of resistance training. An hour of cardio per week.   Acute Concerns: Denies acute concerns today.   Health Maintenance: Immunizations -- Tetanus up-to-date HIV Screening -- Declines.   History reviewed. No pertinent past medical history.  Past Surgical History:  Procedure Laterality Date  . NO PAST SURGERIES      No current outpatient medications on file prior to visit.   No current facility-administered medications on file prior to visit.     No Known Allergies  Family History  Problem Relation Age of Onset  . Healthy Mother   . Healthy Father   . Healthy Sister   . Healthy Brother   . Cancer Maternal Grandmother        lung  . Emphysema Maternal Grandmother   . Dementia Paternal Grandmother     Social History   Socioeconomic History  . Marital status: Married    Spouse name: Not on file  . Number of children: Not on file  . Years of education: Not on file  . Highest education level: Not on file  Occupational History  . Not on file  Social Needs  . Financial resource strain: Not on file  . Food insecurity:    Worry: Not on file    Inability: Not on file  . Transportation needs:    Medical: Not on file    Non-medical: Not on file  Tobacco Use  . Smoking status: Never Smoker  . Smokeless tobacco: Never Used  Substance and Sexual Activity  . Alcohol use: No  . Drug use: No  . Sexual activity: Not on file  Lifestyle  . Physical activity:    Days per week: Not on file    Minutes per session: Not on file  . Stress: Not on file  Relationships  . Social connections:    Talks on phone: Not on file    Gets together: Not on file    Attends religious service: Not on file    Active member of club or  organization: Not on file    Attends meetings of clubs or organizations: Not on file    Relationship status: Not on file  . Intimate partner violence:    Fear of current or ex partner: Not on file    Emotionally abused: Not on file    Physically abused: Not on file    Forced sexual activity: Not on file  Other Topics Concern  . Not on file  Social History Narrative  . Not on file   Review of Systems  Constitutional: Negative for fever and weight loss.  HENT: Negative for ear discharge, ear pain, hearing loss and tinnitus.   Eyes: Negative for blurred vision, double vision, photophobia and pain.  Respiratory: Negative for cough and shortness of breath.   Cardiovascular: Negative for chest pain and palpitations.  Gastrointestinal: Negative for abdominal pain, blood in stool, constipation, diarrhea, heartburn, melena, nausea and vomiting.  Genitourinary: Negative for dysuria, flank pain, frequency, hematuria and urgency.  Musculoskeletal: Negative for falls.  Neurological: Negative for dizziness, loss of consciousness and headaches.  Endo/Heme/Allergies: Negative for environmental allergies.  Psychiatric/Behavioral: Negative for depression, hallucinations, substance abuse and suicidal ideas. The patient is not nervous/anxious and does not have insomnia.  BP 122/70   Pulse 80   Temp 98.5 F (36.9 C) (Oral)   Resp 14   Ht 6' 0.5" (1.842 m)   Wt 173 lb (78.5 kg)   SpO2 98%   BMI 23.14 kg/m   Physical Exam  Constitutional: He is oriented to person, place, and time. He appears well-developed and well-nourished. No distress.  HENT:  Head: Normocephalic and atraumatic.  Right Ear: Tympanic membrane, external ear and ear canal normal.  Left Ear: Tympanic membrane, external ear and ear canal normal.  Nose: Rhinorrhea present.  Mouth/Throat: Oropharynx is clear and moist and mucous membranes are normal. No posterior oropharyngeal edema or posterior oropharyngeal erythema.  Eyes:  Pupils are equal, round, and reactive to light. Conjunctivae are normal.  Neck: Neck supple. No thyromegaly present.  Cardiovascular: Normal rate, regular rhythm, normal heart sounds and intact distal pulses.  Pulmonary/Chest: Effort normal and breath sounds normal. No respiratory distress. He has no wheezes. He has no rales. He exhibits no tenderness.  Abdominal: Soft. Bowel sounds are normal. He exhibits no distension and no mass. There is no tenderness. There is no rebound and no guarding.  Lymphadenopathy:    He has no cervical adenopathy.  Neurological: He is alert and oriented to person, place, and time. No cranial nerve deficit.  Skin: Skin is warm and dry. No rash noted. He is not diaphoretic.  Psychiatric: He has a normal mood and affect.  Vitals reviewed.  Assessment/Plan: Visit for preventive health examination Depression screen negative. Health Maintenance reviewed -- Declines HIV screen. Immunizations are up-to-date. Preventive schedule discussed and handout given in AVS. Will obtain fasting labs today.     Leeanne Rio, PA-C

## 2019-07-13 DIAGNOSIS — Z20828 Contact with and (suspected) exposure to other viral communicable diseases: Secondary | ICD-10-CM | POA: Diagnosis not present

## 2019-10-03 DIAGNOSIS — Z20828 Contact with and (suspected) exposure to other viral communicable diseases: Secondary | ICD-10-CM | POA: Diagnosis not present

## 2019-10-04 ENCOUNTER — Ambulatory Visit: Payer: 59 | Admitting: Family Medicine

## 2019-10-05 ENCOUNTER — Other Ambulatory Visit: Payer: Self-pay

## 2019-10-05 ENCOUNTER — Ambulatory Visit (INDEPENDENT_AMBULATORY_CARE_PROVIDER_SITE_OTHER): Payer: 59 | Admitting: Family Medicine

## 2019-10-05 ENCOUNTER — Encounter: Payer: Self-pay | Admitting: Family Medicine

## 2019-10-05 VITALS — BP 121/78 | HR 89 | Temp 97.2°F | Resp 16 | Ht 73.0 in | Wt 172.5 lb

## 2019-10-05 DIAGNOSIS — Z20828 Contact with and (suspected) exposure to other viral communicable diseases: Secondary | ICD-10-CM | POA: Diagnosis not present

## 2019-10-05 DIAGNOSIS — R109 Unspecified abdominal pain: Secondary | ICD-10-CM

## 2019-10-05 LAB — CBC WITH DIFFERENTIAL/PLATELET
Basophils Absolute: 0.1 10*3/uL (ref 0.0–0.1)
Basophils Relative: 1.1 % (ref 0.0–3.0)
Eosinophils Absolute: 0.1 10*3/uL (ref 0.0–0.7)
Eosinophils Relative: 1.6 % (ref 0.0–5.0)
HCT: 47.7 % (ref 39.0–52.0)
Hemoglobin: 16.8 g/dL (ref 13.0–17.0)
Lymphocytes Relative: 33.1 % (ref 12.0–46.0)
Lymphs Abs: 1.7 10*3/uL (ref 0.7–4.0)
MCHC: 35.2 g/dL (ref 30.0–36.0)
MCV: 88.6 fl (ref 78.0–100.0)
Monocytes Absolute: 0.5 10*3/uL (ref 0.1–1.0)
Monocytes Relative: 9 % (ref 3.0–12.0)
Neutro Abs: 2.9 10*3/uL (ref 1.4–7.7)
Neutrophils Relative %: 55.2 % (ref 43.0–77.0)
Platelets: 230 10*3/uL (ref 150.0–400.0)
RBC: 5.38 Mil/uL (ref 4.22–5.81)
RDW: 12.7 % (ref 11.5–15.5)
WBC: 5.2 10*3/uL (ref 4.0–10.5)

## 2019-10-05 LAB — HEPATIC FUNCTION PANEL
ALT: 17 U/L (ref 0–53)
AST: 17 U/L (ref 0–37)
Albumin: 4.7 g/dL (ref 3.5–5.2)
Alkaline Phosphatase: 64 U/L (ref 39–117)
Bilirubin, Direct: 0.2 mg/dL (ref 0.0–0.3)
Total Bilirubin: 0.7 mg/dL (ref 0.2–1.2)
Total Protein: 6.8 g/dL (ref 6.0–8.3)

## 2019-10-05 LAB — BASIC METABOLIC PANEL
BUN: 17 mg/dL (ref 6–23)
CO2: 30 mEq/L (ref 19–32)
Calcium: 9.6 mg/dL (ref 8.4–10.5)
Chloride: 102 mEq/L (ref 96–112)
Creatinine, Ser: 1.16 mg/dL (ref 0.40–1.50)
GFR: 70.13 mL/min (ref >60.00)
Glucose, Bld: 78 mg/dL (ref 70–99)
Potassium: 4.4 mEq/L (ref 3.5–5.1)
Sodium: 139 mEq/L (ref 135–145)

## 2019-10-05 NOTE — Patient Instructions (Signed)
Follow up as needed or as scheduled We'll notify you of your lab results and make any changes if needed I suspect this is positional/musculoskeletal If symptoms return, please keep a log of when it happens, what makes it better or worse, how long it lasts, etc Call with any questions or concerns Stay Safe!  Stay Healthy!

## 2019-10-05 NOTE — Progress Notes (Signed)
   Subjective:    Patient ID: William Woodard, male    DOB: [birth year], 39 y.o.   MRN: [id number]  HPI abd pain- pt reports sxs have been present off and on for the last month.  Has not had sxs since he scheduled appt (a few days).  Pain is always R sided, just under ribs.  Described as a pressure.  Not TTP.  No N/V.  sxs will resolve spontaneously.  No change w/ BMs.  No change w/ eating.  Last time he had sxs was driving to airport in [place].   Review of Systems For ROS see HPI   This visit occurred during the SARS-CoV-2 public health emergency.  Safety protocols were in place, including screening questions prior to the visit, additional usage of staff PPE, and extensive cleaning of exam room while observing appropriate contact time as indicated for disinfecting solutions.       Objective:   Physical Exam Vitals reviewed.  Constitutional:      General: He is not in acute distress.    Appearance: He is well-developed. He is not ill-appearing.  HENT:     Head: Normocephalic and atraumatic.  Abdominal:     General: Abdomen is flat. There is no distension. There are no signs of injury.     Palpations: There is no fluid wave, hepatomegaly or mass.     Tenderness: There is no abdominal tenderness. There is no guarding or rebound.     Hernia: No hernia is present.  Skin:    General: Skin is warm and dry.     Findings: No rash.  Neurological:     General: No focal deficit present.     Mental Status: He is alert and oriented to person, place, and time.  Psychiatric:        Mood and Affect: Mood is anxious.        Behavior: Behavior normal.           Assessment & Plan:  R sided abdominal pain- new.  Not present on exam today and he has been asymptomatic for the last few days.  No TTP.  No relation to BM or eating.  Sxs are described as a self resolving 'pressure'.  He feels it may be positional and related to how he sits- leaning slightly to R side.  No abnormalities on abdominal  exam today.  Will get labs to assess for infection, biliary abnormality, or electrolyte disturbance.  Reassurance provided.  Pt asked to log any future sxs.  Pt expressed understanding and is in agreement w/ plan.

## 2019-10-26 DIAGNOSIS — D229 Melanocytic nevi, unspecified: Secondary | ICD-10-CM | POA: Diagnosis not present

## 2019-10-26 DIAGNOSIS — D485 Neoplasm of uncertain behavior of skin: Secondary | ICD-10-CM | POA: Diagnosis not present

## 2019-10-26 DIAGNOSIS — L821 Other seborrheic keratosis: Secondary | ICD-10-CM | POA: Diagnosis not present

## 2019-12-21 DIAGNOSIS — Z3009 Encounter for other general counseling and advice on contraception: Secondary | ICD-10-CM | POA: Diagnosis not present

## 2020-01-24 DIAGNOSIS — Z302 Encounter for sterilization: Secondary | ICD-10-CM | POA: Diagnosis not present

## 2020-04-11 ENCOUNTER — Other Ambulatory Visit: Payer: Self-pay

## 2020-04-11 ENCOUNTER — Encounter: Payer: Self-pay | Admitting: Physician Assistant

## 2020-04-11 ENCOUNTER — Ambulatory Visit: Payer: 59 | Admitting: Physician Assistant

## 2020-04-11 VITALS — BP 110/64 | HR 68 | Temp 98.1°F | Resp 16 | Ht 73.0 in | Wt 170.0 lb

## 2020-04-11 DIAGNOSIS — L03115 Cellulitis of right lower limb: Secondary | ICD-10-CM

## 2020-04-11 DIAGNOSIS — T63441A Toxic effect of venom of bees, accidental (unintentional), initial encounter: Secondary | ICD-10-CM

## 2020-04-11 MED ORDER — METHYLPREDNISOLONE 4 MG PO TBPK: ORAL_TABLET | ORAL | 0 refills | Status: DC

## 2020-04-11 MED ORDER — CEPHALEXIN 500 MG PO CAPS: 500.0000 mg | ORAL_CAPSULE | Freq: Three times a day (TID) | ORAL | 0 refills | Status: AC

## 2020-04-11 NOTE — Patient Instructions (Signed)
Apply cold compresses to the foot/ankle and keep elevated. This will help with swelling and discomfort.  Take the steroid pack starting today.  Take Benadryl at night and an OTC Claritin in the mornings.   Take the antibiotic as directed. Symptoms should start to significantly improve over the next 48 hours.  If not, if anything worsens or new symptoms develop, call us ASAP.

## 2020-04-11 NOTE — Progress Notes (Signed)
Patient presents to clinic today c/o bee sting to his R ankle this past Sunday while mowing the lawn. Notes the bee was attached for a short period of time but he removed. No intact stinger. Notes the area was red and became swollen over the next 24 hours. Has been itching but now has become painful just around the sting with increased redness of the skin. Has been taking Benadryl to help with symptoms. Denies fever, chills, malaise or fatigue.   No past medical history on file.  No current outpatient medications on file prior to visit.   No current facility-administered medications on file prior to visit.    No Known Allergies  Family History  Problem Relation Age of Onset  . Healthy Mother   . Healthy Father   . Healthy Sister   . Healthy Brother   . Cancer Maternal Grandmother        lung  . Emphysema Maternal Grandmother   . Dementia Paternal Grandmother     Social History   Socioeconomic History  . Marital status: Married    Spouse name: Not on file  . Number of children: Not on file  . Years of education: Not on file  . Highest education level: Not on file  Occupational History  . Not on file  Tobacco Use  . Smoking status: Never Smoker  . Smokeless tobacco: Never Used  Vaping Use  . Vaping Use: Never used  Substance and Sexual Activity  . Alcohol use: No  . Drug use: No  . Sexual activity: Not on file  Other Topics Concern  . Not on file  Social History Narrative  . Not on file   Social Determinants of Health   Financial Resource Strain:   . Difficulty of Paying Living Expenses:   Food Insecurity:   . Worried About Charity fundraiser in the Last Year:   . Arboriculturist in the Last Year:   Transportation Needs:   . Film/video editor (Medical):   Marland Kitchen Lack of Transportation (Non-Medical):   Physical Activity:   . Days of Exercise per Week:   . Minutes of Exercise per Session:   Stress:   . Feeling of Stress :   Social Connections:   .  Frequency of Communication with Friends and Family:   . Frequency of Social Gatherings with Friends and Family:   . Attends Religious Services:   . Active Member of Clubs or Organizations:   . Attends Archivist Meetings:   Marland Kitchen Marital Status:    Review of Systems - See HPI.  All other ROS are negative.  Wt 170 lb (77.1 kg)   BMI 22.43 kg/m   Physical Exam Vitals reviewed.  Constitutional:      Appearance: Normal appearance.  HENT:     Head: Normocephalic and atraumatic.  Pulmonary:     Effort: Pulmonary effort is normal.  Skin:      Neurological:     General: No focal deficit present.     Mental Status: He is alert and oriented to person, place, and time.    Assessment/Plan: 1. Bee sting reaction, accidental or unintentional, initial encounter Benadryl at night. Claritin AM. Cold compresses and elevation recommended. Rx medrol dose pack due to significant local reaction.  - methylPREDNISolone (MEDROL DOSEPAK) 4 MG TBPK tablet; Take following package directions  Dispense: 21 tablet; Refill: 0  2. Cellulitis of right ankle Supportive measures reviewed. Rx Keflex 500 mg TID.  -  cephALEXin (KEFLEX) 500 MG capsule; Take 1 capsule (500 mg total) by mouth 3 (three) times daily for 7 days.  Dispense: 21 capsule; Refill: 0  This visit occurred during the SARS-CoV-2 public health emergency.  Safety protocols were in place, including screening questions prior to the visit, additional usage of staff PPE, and extensive cleaning of exam room while observing appropriate contact time as indicated for disinfecting solutions.     Leeanne Rio, PA-C

## 2020-08-08 ENCOUNTER — Encounter: Payer: Self-pay | Admitting: Physician Assistant

## 2020-08-08 ENCOUNTER — Other Ambulatory Visit: Payer: Self-pay

## 2020-08-08 ENCOUNTER — Ambulatory Visit: Payer: BC Managed Care – PPO | Admitting: Physician Assistant

## 2020-08-08 VITALS — BP 110/70 | HR 70 | Temp 98.2°F | Resp 16 | Ht 73.0 in | Wt 167.0 lb

## 2020-08-08 DIAGNOSIS — K2990 Gastroduodenitis, unspecified, without bleeding: Secondary | ICD-10-CM | POA: Diagnosis not present

## 2020-08-08 LAB — COMPREHENSIVE METABOLIC PANEL
ALT: 15 U/L (ref 0–53)
AST: 15 U/L (ref 0–37)
Albumin: 4.5 g/dL (ref 3.5–5.2)
Alkaline Phosphatase: 62 U/L (ref 39–117)
BUN: 13 mg/dL (ref 6–23)
CO2: 32 mEq/L (ref 19–32)
Calcium: 9.3 mg/dL (ref 8.4–10.5)
Chloride: 102 mEq/L (ref 96–112)
Creatinine, Ser: 1.13 mg/dL (ref 0.40–1.50)
GFR: 81.74 mL/min (ref >60.00)
Glucose, Bld: 85 mg/dL (ref 70–99)
Potassium: 4.2 mEq/L (ref 3.5–5.1)
Sodium: 139 mEq/L (ref 135–145)
Total Bilirubin: 0.7 mg/dL (ref 0.2–1.2)
Total Protein: 6.4 g/dL (ref 6.0–8.3)

## 2020-08-08 LAB — CBC WITH DIFFERENTIAL/PLATELET
Basophils Absolute: 0.1 10*3/uL (ref 0.0–0.1)
Basophils Relative: 1.5 % (ref 0.0–3.0)
Eosinophils Absolute: 0.1 10*3/uL (ref 0.0–0.7)
Eosinophils Relative: 1.6 % (ref 0.0–5.0)
HCT: 45.6 % (ref 39.0–52.0)
Hemoglobin: 16.2 g/dL (ref 13.0–17.0)
Lymphocytes Relative: 40.2 % (ref 12.0–46.0)
Lymphs Abs: 1.9 10*3/uL (ref 0.7–4.0)
MCHC: 35.6 g/dL (ref 30.0–36.0)
MCV: 87 fl (ref 78.0–100.0)
Monocytes Absolute: 0.4 10*3/uL (ref 0.1–1.0)
Monocytes Relative: 9 % (ref 3.0–12.0)
Neutro Abs: 2.3 10*3/uL (ref 1.4–7.7)
Neutrophils Relative %: 47.7 % (ref 43.0–77.0)
Platelets: 207 10*3/uL (ref 150.0–400.0)
RBC: 5.24 Mil/uL (ref 4.22–5.81)
RDW: 12.7 % (ref 11.5–15.5)
WBC: 4.7 10*3/uL (ref 4.0–10.5)

## 2020-08-08 LAB — LIPASE: Lipase: 43 U/L (ref 11.0–59.0)

## 2020-08-08 MED ORDER — OMEPRAZOLE 20 MG PO CPDR: 20.0000 mg | DELAYED_RELEASE_CAPSULE | Freq: Every day | ORAL | 1 refills | Status: DC

## 2020-08-08 NOTE — Patient Instructions (Signed)
Please go to the lab today for blood work.  I will call you with your results. We will alter treatment regimen(s) if indicated by your results.   Please start the medication I have sent in, taking daily for 2 weeks. Avoid any use of Aleve or Ibuprofen. Start a daily OTC probiotic (align, Culturelle, etc -- IB Guard is a good option if you can find at a reasonable price).     Food Choices for Gastroesophageal Reflux Disease, Adult When you have gastroesophageal reflux disease (GERD), the foods you eat and your eating habits are very important. Choosing the right foods can help ease your discomfort. Think about working with a nutrition specialist (dietitian) to help you make good choices. What are tips for following this plan?  Meals  Choose healthy foods that are low in fat, such as fruits, vegetables, whole grains, low-fat dairy products, and lean meat, fish, and poultry.  Eat small meals often instead of 3 large meals a day. Eat your meals slowly, and in a place where you are relaxed. Avoid bending over or lying down until 2-3 hours after eating.  Avoid eating meals 2-3 hours before bed.  Avoid drinking a lot of liquid with meals.  Cook foods using methods other than frying. Bake, grill, or broil food instead.  Avoid or limit: ? Chocolate. ? Peppermint or spearmint. ? Alcohol. ? Pepper. ? Black and decaffeinated coffee. ? Black and decaffeinated tea. ? Bubbly (carbonated) soft drinks. ? Caffeinated energy drinks and soft drinks.  Limit high-fat foods such as: ? Fatty meat or fried foods. ? Whole milk, cream, butter, or ice cream. ? Nuts and nut butters. ? Pastries, donuts, and sweets made with butter or shortening.  Avoid foods that cause symptoms. These foods may be different for everyone. Common foods that cause symptoms include: ? Tomatoes. ? Oranges, lemons, and limes. ? Peppers. ? Spicy food. ? Onions and garlic. ? Vinegar. Lifestyle  Maintain a healthy weight.  Ask your doctor what weight is healthy for you. If you need to lose weight, work with your doctor to do so safely.  Exercise for at least 30 minutes for 5 or more days each week, or as told by your doctor.  Wear loose-fitting clothes.  Do not smoke. If you need help quitting, ask your doctor.  Sleep with the head of your bed higher than your feet. Use a wedge under the mattress or blocks under the bed frame to raise the head of the bed. Summary  When you have gastroesophageal reflux disease (GERD), food and lifestyle choices are very important in easing your symptoms.  Eat small meals often instead of 3 large meals a day. Eat your meals slowly, and in a place where you are relaxed.  Limit high-fat foods such as fatty meat or fried foods.  Avoid bending over or lying down until 2-3 hours after eating.  Avoid peppermint and spearmint, caffeine, alcohol, and chocolate. This information is not intended to replace advice given to you by your health care provider. Make sure you discuss any questions you have with your health care provider. Document Revised: 12/10/2018 Document Reviewed: 09/24/2016 Elsevier Patient Education  [place].

## 2020-08-08 NOTE — Progress Notes (Signed)
Patient presents to clinic today c/o 2 weeks of intermittent heartburn and abdominal discomfort, worse after eating.  Is associated with occasional nausea but denies any emesis.  Denies change to bowel or bladder habits.  Denies tenesmus, melena or hematochezia.  Denies NSAID use, tobacco use or alcohol consumption.  Denies any recent change to his diet.  Is a very active individual.  Does note some increased stressors recently --at work, at home and with family illnesses.  A friend was recently diagnosed with cancer.  Patient has not tried to take anything for symptoms.   History reviewed. No pertinent past medical history.  No current outpatient medications on file prior to visit.   No current facility-administered medications on file prior to visit.    No Known Allergies  Family History  Problem Relation Age of Onset  . Healthy Mother   . Healthy Father   . Healthy Sister   . Healthy Brother   . Cancer Maternal Grandmother        lung  . Emphysema Maternal Grandmother   . Dementia Paternal Grandmother     Social History   Socioeconomic History  . Marital status: Married    Spouse name: Not on file  . Number of children: Not on file  . Years of education: Not on file  . Highest education level: Not on file  Occupational History  . Not on file  Tobacco Use  . Smoking status: Never Smoker  . Smokeless tobacco: Never Used  Vaping Use  . Vaping Use: Never used  Substance and Sexual Activity  . Alcohol use: No  . Drug use: No  . Sexual activity: Yes  Other Topics Concern  . Not on file  Social History Narrative  . Not on file   Social Determinants of Health   Financial Resource Strain:   . Difficulty of Paying Living Expenses: Not on file  Food Insecurity:   . Worried About Charity fundraiser in the Last Year: Not on file  . Ran Out of Food in the Last Year: Not on file  Transportation Needs:   . Lack of Transportation (Medical): Not on file  . Lack of  Transportation (Non-Medical): Not on file  Physical Activity:   . Days of Exercise per Week: Not on file  . Minutes of Exercise per Session: Not on file  Stress:   . Feeling of Stress : Not on file  Social Connections:   . Frequency of Communication with Friends and Family: Not on file  . Frequency of Social Gatherings with Friends and Family: Not on file  . Attends Religious Services: Not on file  . Active Member of Clubs or Organizations: Not on file  . Attends Archivist Meetings: Not on file  . Marital Status: Not on file   Review of Systems - See HPI.  All other ROS are negative.  BP 110/70   Pulse 70   Temp 98.2 F (36.8 C) (Temporal)   Resp 16   Ht _0  (1.854 m)   Wt 167 lb (75.8 kg)   SpO2 99%   BMI 22.03 kg/m   Physical Exam Vitals reviewed.  Constitutional:      Appearance: Normal appearance.  HENT:     Head: Normocephalic and atraumatic.  Cardiovascular:     Rate and Rhythm: Normal rate and regular rhythm.     Pulses: Normal pulses.     Heart sounds: Normal heart sounds.  Pulmonary:  Effort: Pulmonary effort is normal.     Breath sounds: Normal breath sounds.  Abdominal:     General: Abdomen is flat. Bowel sounds are normal. There is no distension.     Palpations: Abdomen is soft. There is no mass.     Tenderness: There is no abdominal tenderness.     Hernia: No hernia is present.  Musculoskeletal:     Cervical back: Neck supple.  Neurological:     General: No focal deficit present.     Mental Status: He is alert and oriented to person, place, and time.  Psychiatric:        Mood and Affect: Mood normal.    Assessment/Plan: 1. Gastritis and duodenitis Seems more consistent with stress-induced GERD and gastritis.  We will start omeprazole 20 mg daily.  GERD diet reviewed.  Avoid NSAIDs and alcohol.  Hydration discussed.  Giving patient's concerns we will proceed with lab assessment including CBC, CMP, lipase and H. pylori.  Follow-up  discussed with patient.  Will alter treatment based on lab findings and response to initial therapy. - CBC w/Diff - Comp Met (CMET) - Lipase - H. pylori antibody, IgG  This visit occurred during the SARS-CoV-2 public health emergency.  Safety protocols were in place, including screening questions prior to the visit, additional usage of staff PPE, and extensive cleaning of exam room while observing appropriate contact time as indicated for disinfecting solutions.     Leeanne Rio, PA-C

## 2020-08-09 LAB — H. PYLORI ANTIBODY, IGG: H Pylori IgG: NEGATIVE

## 2020-08-30 ENCOUNTER — Telehealth: Payer: BC Managed Care – PPO | Admitting: Family

## 2020-08-30 DIAGNOSIS — J029 Acute pharyngitis, unspecified: Secondary | ICD-10-CM

## 2020-08-30 MED ORDER — AMOXICILLIN 500 MG PO CAPS: 500.0000 mg | ORAL_CAPSULE | Freq: Two times a day (BID) | ORAL | 0 refills | Status: DC

## 2020-08-30 NOTE — Progress Notes (Signed)

## 2021-02-28 ENCOUNTER — Encounter: Payer: Self-pay | Admitting: *Deleted

## 2021-06-26 ENCOUNTER — Ambulatory Visit (INDEPENDENT_AMBULATORY_CARE_PROVIDER_SITE_OTHER): Payer: BC Managed Care – PPO | Admitting: Family Medicine

## 2021-06-26 ENCOUNTER — Encounter: Payer: Self-pay | Admitting: Family Medicine

## 2021-06-26 ENCOUNTER — Other Ambulatory Visit: Payer: Self-pay

## 2021-06-26 VITALS — BP 112/70 | HR 75 | Temp 98.4°F | Resp 16 | Ht 73.0 in | Wt 168.0 lb

## 2021-06-26 DIAGNOSIS — E785 Hyperlipidemia, unspecified: Secondary | ICD-10-CM | POA: Diagnosis not present

## 2021-06-26 DIAGNOSIS — Z114 Encounter for screening for human immunodeficiency virus [HIV]: Secondary | ICD-10-CM

## 2021-06-26 DIAGNOSIS — Z1159 Encounter for screening for other viral diseases: Secondary | ICD-10-CM

## 2021-06-26 DIAGNOSIS — Z0000 Encounter for general adult medical examination without abnormal findings: Secondary | ICD-10-CM | POA: Diagnosis not present

## 2021-06-26 LAB — BASIC METABOLIC PANEL
BUN: 17 mg/dL (ref 6–23)
CO2: 32 mEq/L (ref 19–32)
Calcium: 9.2 mg/dL (ref 8.4–10.5)
Chloride: 102 mEq/L (ref 96–112)
Creatinine, Ser: 1.28 mg/dL (ref 0.40–1.50)
GFR: 69.95 mL/min (ref >60.00)
Glucose, Bld: 68 mg/dL — ABNORMAL LOW (ref 70–99)
Potassium: 4.2 mEq/L (ref 3.5–5.1)
Sodium: 140 mEq/L (ref 135–145)

## 2021-06-26 LAB — LIPID PANEL
Cholesterol: 147 mg/dL (ref 0–200)
HDL: 41.7 mg/dL (ref >39.00)
LDL Cholesterol: 75 mg/dL (ref 0–99)
NonHDL: 105.07
Total CHOL/HDL Ratio: 4
Triglycerides: 151 mg/dL — ABNORMAL HIGH (ref 0.0–149.0)
VLDL: 30.2 mg/dL (ref 0.0–40.0)

## 2021-06-26 LAB — CBC WITH DIFFERENTIAL/PLATELET
Basophils Absolute: 0 10*3/uL (ref 0.0–0.1)
Basophils Relative: 0.7 % (ref 0.0–3.0)
Eosinophils Absolute: 0.1 10*3/uL (ref 0.0–0.7)
Eosinophils Relative: 1.8 % (ref 0.0–5.0)
HCT: 45 % (ref 39.0–52.0)
Hemoglobin: 15.8 g/dL (ref 13.0–17.0)
Lymphocytes Relative: 36.1 % (ref 12.0–46.0)
Lymphs Abs: 1.7 10*3/uL (ref 0.7–4.0)
MCHC: 35 g/dL (ref 30.0–36.0)
MCV: 86.2 fl (ref 78.0–100.0)
Monocytes Absolute: 0.4 10*3/uL (ref 0.1–1.0)
Monocytes Relative: 8.9 % (ref 3.0–12.0)
Neutro Abs: 2.4 10*3/uL (ref 1.4–7.7)
Neutrophils Relative %: 52.5 % (ref 43.0–77.0)
Platelets: 197 10*3/uL (ref 150.0–400.0)
RBC: 5.22 Mil/uL (ref 4.22–5.81)
RDW: 12.9 % (ref 11.5–15.5)
WBC: 4.6 10*3/uL (ref 4.0–10.5)

## 2021-06-26 LAB — HEPATIC FUNCTION PANEL
ALT: 21 U/L (ref 0–53)
AST: 18 U/L (ref 0–37)
Albumin: 4.6 g/dL (ref 3.5–5.2)
Alkaline Phosphatase: 64 U/L (ref 39–117)
Bilirubin, Direct: 0.1 mg/dL (ref 0.0–0.3)
Total Bilirubin: 0.6 mg/dL (ref 0.2–1.2)
Total Protein: 6.5 g/dL (ref 6.0–8.3)

## 2021-06-26 LAB — TSH: TSH: 1.3 u[IU]/mL (ref 0.35–5.50)

## 2021-06-26 NOTE — Assessment & Plan Note (Signed)
Pt's PE WNL.  UTD on Tdap.  Declines flu.  Check labs.  Anticipatory guidance provided.

## 2021-06-26 NOTE — Progress Notes (Signed)
   Subjective:    Patient ID: William Woodard, male    DOB: October 06, 1980, 40 y.o.   MRN: [id number]  HPI CPE- UTD on Tdap.  Declines flu shot.  Due for Hep C and HIV screenings.  Pt reports feeling well.    Health Maintenance  Topic Date Due   HIV Screening  Never done   Hepatitis C Screening  Never done   COVID-19 Vaccine (1) 07/12/2021 (Originally 05/10/1981)   INFLUENZA VACCINE  11/30/2021 (Originally 04/02/2021)   TETANUS/TDAP  07/09/2026   Pneumococcal Vaccine 62-49 Years old  Aged Out   HPV VACCINES  Aged Out      Review of Systems Patient reports no vision/hearing changes, anorexia, fever ,adenopathy, persistant/recurrent hoarseness, swallowing issues, chest pain, palpitations, edema, persistant/recurrent cough, hemoptysis, dyspnea (rest,exertional, paroxysmal nocturnal), gastrointestinal  bleeding (melena, rectal bleeding), abdominal pain, excessive heart burn, GU symptoms (dysuria, hematuria, voiding/incontinence issues) syncope, focal weakness, memory loss, skin/hair/nail changes, depression, anxiety, abnormal bruising/bleeding, musculoskeletal symptoms/signs.   + intermittent numbness of R great toe  This visit occurred during the SARS-CoV-2 public health emergency.  Safety protocols were in place, including screening questions prior to the visit, additional usage of staff PPE, and extensive cleaning of exam room while observing appropriate contact time as indicated for disinfecting solutions.      Objective:   Physical Exam General Appearance:    Alert, cooperative, no distress, appears stated age  Head:    Normocephalic, without obvious abnormality, atraumatic  Eyes:    PERRL, conjunctiva/corneas clear, EOM's intact, fundi    benign, both eyes       Ears:    Normal TM's and external ear canals, both ears  Nose:   Deferred due to COVID  Throat:   Neck:   Supple, symmetrical, trachea midline, no adenopathy;       thyroid:  No enlargement/tenderness/nodules  Back:     Symmetric,  no curvature, ROM normal, no CVA tenderness  Lungs:     Clear to auscultation bilaterally, respirations unlabored  Chest wall:    No tenderness or deformity  Heart:    Regular rate and rhythm, S1 and S2 normal, no murmur, rub   or gallop  Abdomen:     Soft, non-tender, bowel sounds active all four quadrants,    no masses, no organomegaly  Genitalia:    Deferred  Rectal:    Extremities:   Extremities normal, atraumatic, no cyanosis or edema  Pulses:   2+ and symmetric all extremities  Skin:   Skin color, texture, turgor normal, no rashes or lesions  Lymph nodes:   Cervical, supraclavicular, and axillary nodes normal  Neurologic:   CNII-XII intact. Normal strength, sensation and reflexes      throughout          Assessment & Plan:

## 2021-06-26 NOTE — Patient Instructions (Signed)
Follow up in 1 year or as needed We'll notify you of your lab results and make any changes if needed Keep up the good work on healthy diet and regular exercise Call with any questions or concerns Stay Safe!  Stay Healthy! Enjoy your trip!!!

## 2021-06-27 LAB — HEPATITIS C ANTIBODY
Hepatitis C Ab: NONREACTIVE
SIGNAL TO CUT-OFF: 0.05 (ref <1.00)

## 2021-06-27 LAB — HIV ANTIBODY (ROUTINE TESTING W REFLEX): HIV 1&2 Ab, 4th Generation: NONREACTIVE

## 2022-01-23 DIAGNOSIS — M7502 Adhesive capsulitis of left shoulder: Secondary | ICD-10-CM | POA: Diagnosis not present

## 2022-01-23 DIAGNOSIS — M25512 Pain in left shoulder: Secondary | ICD-10-CM | POA: Diagnosis not present

## 2022-02-19 DIAGNOSIS — M25512 Pain in left shoulder: Secondary | ICD-10-CM | POA: Diagnosis not present

## 2022-02-19 DIAGNOSIS — M7502 Adhesive capsulitis of left shoulder: Secondary | ICD-10-CM | POA: Diagnosis not present

## 2022-02-21 DIAGNOSIS — M7502 Adhesive capsulitis of left shoulder: Secondary | ICD-10-CM | POA: Diagnosis not present

## 2022-02-21 DIAGNOSIS — M25512 Pain in left shoulder: Secondary | ICD-10-CM | POA: Diagnosis not present

## 2022-02-26 DIAGNOSIS — M7502 Adhesive capsulitis of left shoulder: Secondary | ICD-10-CM | POA: Diagnosis not present

## 2022-02-26 DIAGNOSIS — M25512 Pain in left shoulder: Secondary | ICD-10-CM | POA: Diagnosis not present

## 2022-02-28 DIAGNOSIS — M7502 Adhesive capsulitis of left shoulder: Secondary | ICD-10-CM | POA: Diagnosis not present

## 2022-02-28 DIAGNOSIS — M25512 Pain in left shoulder: Secondary | ICD-10-CM | POA: Diagnosis not present

## 2022-03-04 DIAGNOSIS — M7502 Adhesive capsulitis of left shoulder: Secondary | ICD-10-CM | POA: Diagnosis not present

## 2022-03-04 DIAGNOSIS — M25512 Pain in left shoulder: Secondary | ICD-10-CM | POA: Diagnosis not present

## 2022-03-07 DIAGNOSIS — M7502 Adhesive capsulitis of left shoulder: Secondary | ICD-10-CM | POA: Diagnosis not present

## 2022-03-07 DIAGNOSIS — M25512 Pain in left shoulder: Secondary | ICD-10-CM | POA: Diagnosis not present

## 2022-03-12 DIAGNOSIS — M7502 Adhesive capsulitis of left shoulder: Secondary | ICD-10-CM | POA: Diagnosis not present

## 2022-03-12 DIAGNOSIS — M25512 Pain in left shoulder: Secondary | ICD-10-CM | POA: Diagnosis not present

## 2022-03-14 DIAGNOSIS — M25512 Pain in left shoulder: Secondary | ICD-10-CM | POA: Diagnosis not present

## 2022-03-14 DIAGNOSIS — M7502 Adhesive capsulitis of left shoulder: Secondary | ICD-10-CM | POA: Diagnosis not present

## 2022-03-22 DIAGNOSIS — M25512 Pain in left shoulder: Secondary | ICD-10-CM | POA: Diagnosis not present

## 2022-03-22 DIAGNOSIS — M7502 Adhesive capsulitis of left shoulder: Secondary | ICD-10-CM | POA: Diagnosis not present

## 2022-03-26 DIAGNOSIS — M25512 Pain in left shoulder: Secondary | ICD-10-CM | POA: Diagnosis not present

## 2022-03-26 DIAGNOSIS — M7502 Adhesive capsulitis of left shoulder: Secondary | ICD-10-CM | POA: Diagnosis not present

## 2022-03-29 DIAGNOSIS — M25512 Pain in left shoulder: Secondary | ICD-10-CM | POA: Diagnosis not present

## 2022-03-29 DIAGNOSIS — M7502 Adhesive capsulitis of left shoulder: Secondary | ICD-10-CM | POA: Diagnosis not present

## 2022-04-08 DIAGNOSIS — M25512 Pain in left shoulder: Secondary | ICD-10-CM | POA: Diagnosis not present

## 2022-04-08 DIAGNOSIS — M7502 Adhesive capsulitis of left shoulder: Secondary | ICD-10-CM | POA: Diagnosis not present

## 2022-04-15 DIAGNOSIS — M7502 Adhesive capsulitis of left shoulder: Secondary | ICD-10-CM | POA: Diagnosis not present

## 2022-04-15 DIAGNOSIS — M25512 Pain in left shoulder: Secondary | ICD-10-CM | POA: Diagnosis not present

## 2022-04-22 DIAGNOSIS — M25512 Pain in left shoulder: Secondary | ICD-10-CM | POA: Diagnosis not present

## 2022-04-22 DIAGNOSIS — M7502 Adhesive capsulitis of left shoulder: Secondary | ICD-10-CM | POA: Diagnosis not present

## 2022-04-29 DIAGNOSIS — M25512 Pain in left shoulder: Secondary | ICD-10-CM | POA: Diagnosis not present

## 2022-04-29 DIAGNOSIS — M7502 Adhesive capsulitis of left shoulder: Secondary | ICD-10-CM | POA: Diagnosis not present

## 2022-05-01 DIAGNOSIS — M7502 Adhesive capsulitis of left shoulder: Secondary | ICD-10-CM | POA: Diagnosis not present

## 2022-05-01 DIAGNOSIS — M25512 Pain in left shoulder: Secondary | ICD-10-CM | POA: Diagnosis not present

## 2022-05-07 DIAGNOSIS — M25512 Pain in left shoulder: Secondary | ICD-10-CM | POA: Diagnosis not present

## 2022-05-07 DIAGNOSIS — M7502 Adhesive capsulitis of left shoulder: Secondary | ICD-10-CM | POA: Diagnosis not present

## 2022-05-09 DIAGNOSIS — M25512 Pain in left shoulder: Secondary | ICD-10-CM | POA: Diagnosis not present

## 2022-05-09 DIAGNOSIS — M7502 Adhesive capsulitis of left shoulder: Secondary | ICD-10-CM | POA: Diagnosis not present

## 2022-05-20 DIAGNOSIS — M25512 Pain in left shoulder: Secondary | ICD-10-CM | POA: Diagnosis not present

## 2022-05-20 DIAGNOSIS — M7502 Adhesive capsulitis of left shoulder: Secondary | ICD-10-CM | POA: Diagnosis not present

## 2022-05-22 DIAGNOSIS — M25512 Pain in left shoulder: Secondary | ICD-10-CM | POA: Diagnosis not present

## 2022-05-22 DIAGNOSIS — M7502 Adhesive capsulitis of left shoulder: Secondary | ICD-10-CM | POA: Diagnosis not present

## 2022-05-27 DIAGNOSIS — M7502 Adhesive capsulitis of left shoulder: Secondary | ICD-10-CM | POA: Diagnosis not present

## 2022-05-27 DIAGNOSIS — M25512 Pain in left shoulder: Secondary | ICD-10-CM | POA: Diagnosis not present

## 2022-05-29 DIAGNOSIS — M25512 Pain in left shoulder: Secondary | ICD-10-CM | POA: Diagnosis not present

## 2022-05-29 DIAGNOSIS — M7502 Adhesive capsulitis of left shoulder: Secondary | ICD-10-CM | POA: Diagnosis not present

## 2022-06-03 DIAGNOSIS — M25512 Pain in left shoulder: Secondary | ICD-10-CM | POA: Diagnosis not present

## 2022-06-03 DIAGNOSIS — M7502 Adhesive capsulitis of left shoulder: Secondary | ICD-10-CM | POA: Diagnosis not present

## 2022-06-05 DIAGNOSIS — M7502 Adhesive capsulitis of left shoulder: Secondary | ICD-10-CM | POA: Diagnosis not present

## 2022-06-05 DIAGNOSIS — M25512 Pain in left shoulder: Secondary | ICD-10-CM | POA: Diagnosis not present

## 2022-06-08 ENCOUNTER — Telehealth: Payer: BC Managed Care – PPO | Admitting: Physician Assistant

## 2022-06-08 DIAGNOSIS — N451 Epididymitis: Secondary | ICD-10-CM

## 2022-06-08 NOTE — Progress Notes (Signed)
Virtual Visit Consent   William Woodard, you are scheduled for a virtual visit with a [facility] provider today. Just as with appointments in the office, your consent must be obtained to participate. Your consent will be active for this visit and any virtual visit you may have with one of our providers in the next 365 days. If you have a MyChart account, a copy of this consent can be sent to you electronically.  As this is a virtual visit, video technology does not allow for your provider to perform a traditional examination. This may limit your provider's ability to fully assess your condition. If your provider identifies any concerns that need to be evaluated in person or the need to arrange testing (such as labs, EKG, etc.), we will make arrangements to do so. Although advances in technology are sophisticated, we cannot ensure that it will always work on either your end or our end. If the connection with a video visit is poor, the visit may have to be switched to a telephone visit. With either a video or telephone visit, we are not always able to ensure that we have a secure connection.  By engaging in this virtual visit, you consent to the provision of healthcare and authorize for your insurance to be billed (if applicable) for the services provided during this visit. Depending on your insurance coverage, you may receive a charge related to this service.  I need to obtain your verbal consent now. Are you willing to proceed with your visit today? William Woodard has provided verbal consent on 06/08/2022 for a virtual visit (video or telephone). Leeanne Rio, [place]  Date: 06/08/2022 3:35 PM  Virtual Visit via Video Note   I, Leeanne Rio, connected with  William Woodard  ([id number], [birth year]) on 06/08/22 at  3:15 PM EDT by a video-enabled telemedicine application and verified that I am speaking with the correct person using two identifiers.  Location: Patient: Virtual Visit Location Patient:  Home Provider: Virtual Visit Location Provider: Home Office   I discussed the limitations of evaluation and management by telemedicine and the availability of in person appointments. The patient expressed understanding and agreed to proceed.    History of Present Illness: William Woodard is a 41 y.o. who identifies as a male who was assigned male at birth, and is being seen today for for pain in R scrotal region first noted this morning. Notes was feeling fine overall last night. Overnight and into today with some aching in R upper scrotal area at top of testicle. Denies fever, chills, malaise. Denies urinary urgency, frequency, dysuria, testicular or scrotal swelling. Denies skin changes. Denies penile pain or discharge. Denies any concern for STI. Did go to gym day before with some heavy lifting. No known trauma or injury. Felt in the area and noted a swollen structure at top back of testicle that is tender. Testicle itself is not tender to touch. No symptoms of L scrotal region.   HPI: HPI  Problems:  Patient Active Problem List   Diagnosis Date Noted   Visit for preventive health examination 12/23/2017    Allergies: No Known Allergies Medications: No current outpatient medications on file.  Observations/Objective: Patient is well-developed, well-nourished in no acute distress.  Resting comfortably at home.  Head is normocephalic, atraumatic.  No labored breathing.  Speech is clear and coherent with logical content.  Patient is alert and oriented at baseline.   Assessment and Plan: 1. Right epididymitis  Symptoms and patient performed  exam seem most consistent with epididymitis (inflammatory). No symptoms of UTI or risk/concerns for STI. Discussed that without a detailed in person exam, urine testing and in some cases [place], we can not definitively rule out other causes. As such he agrees to be evaluated in person ASAP for any new or worsening symptoms despite below treatment. He has also  been instructed to schedule PCP follow-up early next week when office is open again. Will have him wear supportive undergarments to keep testicle elevated to help with pain. Start Meloxicam once daily. Tylenol for breakthrough pain. Ice pack recommended. Avoid any heavy lifting or gym activity until resolved and repeat evaluation in person.   Follow Up Instructions: I discussed the assessment and treatment plan with the patient. The patient was provided an opportunity to ask questions and all were answered. The patient agreed with the plan and demonstrated an understanding of the instructions.  A copy of instructions were sent to the patient via MyChart unless otherwise noted below.   The patient was advised to call back or seek an in-person evaluation if the symptoms worsen or if the condition fails to improve as anticipated.  Time:  I spent 10 minutes with the patient via telehealth technology discussing the above problems/concerns.    Leeanne Rio, PA-C

## 2022-06-08 NOTE — Patient Instructions (Signed)
  Porfirio Oar, thank you for joining Leeanne Rio, PA-C for today's virtual visit.  While this provider is not your primary care provider (PCP), if your PCP is located in our provider database this encounter information will be shared with them immediately following your visit.  Consent: (Patient) William Woodard provided verbal consent for this virtual visit at the beginning of the encounter.  Current Medications: No current outpatient medications on file.   Medications ordered in this encounter:  No orders of the defined types were placed in this encounter.    *If you need refills on other medications prior to your next appointment, please contact your pharmacy*  Follow-Up: Call back or seek an in-person evaluation if the symptoms worsen or if the condition fails to improve as anticipated.  [place] [phone]  Other Instructions Please avoid heavy lifting and straining. Take the Meloxicam once daily with food. Tylenol throughout day if needed for any breakthrough pain or soreness. Wear supportive undergarments (briefs) to help elevate the testicles which can alleviate discomfort.  Ice pack to the area (wrapped in a towel or cloth) for 10 minutes, a few times per day.   I want you to schedule in person follow-up with Dr. Virgil Benedict office first thing Monday.  Anything worsening at all or not improving over weekend, I want you to be evaluated in person at [facility] or ER.    If you have been instructed to have an in-person evaluation today at a local Urgent Care facility, please use the link below. It will take you to a list of all of our available [facility], including address, phone number and hours of operation. Please do not delay care.  [facility]  If you or a family member do not have a primary care provider, use the link below to schedule a visit and establish care. When you choose a [facility] primary care physician or advanced  practice provider, you gain a long-term partner in health. Find a Primary Care Provider  Learn more about 's in-office and virtual care options: [facility]

## 2022-06-10 DIAGNOSIS — M25512 Pain in left shoulder: Secondary | ICD-10-CM | POA: Diagnosis not present

## 2022-06-10 DIAGNOSIS — M7502 Adhesive capsulitis of left shoulder: Secondary | ICD-10-CM | POA: Diagnosis not present

## 2022-06-17 DIAGNOSIS — M25512 Pain in left shoulder: Secondary | ICD-10-CM | POA: Diagnosis not present

## 2022-06-17 DIAGNOSIS — M7502 Adhesive capsulitis of left shoulder: Secondary | ICD-10-CM | POA: Diagnosis not present

## 2022-06-26 DIAGNOSIS — M25512 Pain in left shoulder: Secondary | ICD-10-CM | POA: Diagnosis not present

## 2022-06-26 DIAGNOSIS — M7502 Adhesive capsulitis of left shoulder: Secondary | ICD-10-CM | POA: Diagnosis not present
